# Patient Record
Sex: Female | Born: 1970 | Race: White | Hispanic: No | Marital: Single | Smoking: Current every day smoker
Health system: Southern US, Community
[De-identification: ages and names within clinical notes are randomized; demographics above are authoritative.]

## PROBLEM LIST (undated history)

## (undated) DIAGNOSIS — N809 Endometriosis, unspecified: Secondary | ICD-10-CM

## (undated) HISTORY — PX: ABDOMINAL HYSTERECTOMY: SHX81

---

## 1997-09-29 ENCOUNTER — Inpatient Hospital Stay (HOSPITAL_COMMUNITY): Admission: AD | Admit: 1997-09-29 | Discharge: 1997-09-29 | Payer: Self-pay | Admitting: Obstetrics and Gynecology

## 1997-10-01 ENCOUNTER — Inpatient Hospital Stay (HOSPITAL_COMMUNITY): Admission: AD | Admit: 1997-10-01 | Discharge: 1997-10-01 | Payer: Self-pay | Admitting: Obstetrics and Gynecology

## 1997-10-03 ENCOUNTER — Ambulatory Visit (HOSPITAL_COMMUNITY): Admission: RE | Admit: 1997-10-03 | Discharge: 1997-10-03 | Payer: Self-pay | Admitting: *Deleted

## 1997-10-25 ENCOUNTER — Inpatient Hospital Stay (HOSPITAL_COMMUNITY): Admission: AD | Admit: 1997-10-25 | Discharge: 1997-10-25 | Payer: Self-pay | Admitting: Obstetrics and Gynecology

## 1997-11-08 ENCOUNTER — Ambulatory Visit (HOSPITAL_COMMUNITY): Admission: RE | Admit: 1997-11-08 | Discharge: 1997-11-08 | Payer: Self-pay

## 1997-12-08 ENCOUNTER — Inpatient Hospital Stay (HOSPITAL_COMMUNITY): Admission: AD | Admit: 1997-12-08 | Discharge: 1997-12-11 | Payer: Self-pay | Admitting: Obstetrics & Gynecology

## 1999-07-22 ENCOUNTER — Emergency Department (HOSPITAL_COMMUNITY): Admission: EM | Admit: 1999-07-22 | Discharge: 1999-07-22 | Payer: Self-pay | Admitting: Emergency Medicine

## 1999-07-22 ENCOUNTER — Encounter: Payer: Self-pay | Admitting: Emergency Medicine

## 1999-09-02 ENCOUNTER — Inpatient Hospital Stay (HOSPITAL_COMMUNITY): Admission: AD | Admit: 1999-09-02 | Discharge: 1999-09-02 | Payer: Self-pay | Admitting: Obstetrics and Gynecology

## 1999-09-17 ENCOUNTER — Other Ambulatory Visit: Admission: RE | Admit: 1999-09-17 | Discharge: 1999-09-17 | Payer: Self-pay | Admitting: Obstetrics and Gynecology

## 1999-10-30 ENCOUNTER — Encounter (INDEPENDENT_AMBULATORY_CARE_PROVIDER_SITE_OTHER): Payer: Self-pay

## 1999-10-30 ENCOUNTER — Other Ambulatory Visit: Admission: RE | Admit: 1999-10-30 | Discharge: 1999-10-30 | Payer: Self-pay | Admitting: Obstetrics and Gynecology

## 1999-12-05 ENCOUNTER — Ambulatory Visit (HOSPITAL_COMMUNITY): Admission: RE | Admit: 1999-12-05 | Discharge: 1999-12-05 | Payer: Self-pay | Admitting: Obstetrics and Gynecology

## 2000-03-31 ENCOUNTER — Inpatient Hospital Stay (HOSPITAL_COMMUNITY): Admission: AD | Admit: 2000-03-31 | Discharge: 2000-03-31 | Payer: Self-pay | Admitting: Obstetrics and Gynecology

## 2000-05-13 ENCOUNTER — Encounter (INDEPENDENT_AMBULATORY_CARE_PROVIDER_SITE_OTHER): Payer: Self-pay

## 2000-05-14 ENCOUNTER — Inpatient Hospital Stay (HOSPITAL_COMMUNITY): Admission: RE | Admit: 2000-05-14 | Discharge: 2000-05-15 | Payer: Self-pay | Admitting: Obstetrics and Gynecology

## 2000-05-20 ENCOUNTER — Inpatient Hospital Stay (HOSPITAL_COMMUNITY): Admission: AD | Admit: 2000-05-20 | Discharge: 2000-05-20 | Payer: Self-pay | Admitting: Obstetrics and Gynecology

## 2000-05-24 ENCOUNTER — Inpatient Hospital Stay (HOSPITAL_COMMUNITY): Admission: AD | Admit: 2000-05-24 | Discharge: 2000-05-24 | Payer: Self-pay | Admitting: *Deleted

## 2000-09-19 ENCOUNTER — Emergency Department (HOSPITAL_COMMUNITY): Admission: EM | Admit: 2000-09-19 | Discharge: 2000-09-19 | Payer: Self-pay | Admitting: *Deleted

## 2000-10-09 ENCOUNTER — Encounter: Payer: Self-pay | Admitting: Emergency Medicine

## 2000-10-09 ENCOUNTER — Emergency Department (HOSPITAL_COMMUNITY): Admission: EM | Admit: 2000-10-09 | Discharge: 2000-10-09 | Payer: Self-pay | Admitting: Emergency Medicine

## 2000-11-02 ENCOUNTER — Emergency Department (HOSPITAL_COMMUNITY): Admission: EM | Admit: 2000-11-02 | Discharge: 2000-11-02 | Payer: Self-pay | Admitting: Emergency Medicine

## 2000-11-04 ENCOUNTER — Encounter: Admission: RE | Admit: 2000-11-04 | Discharge: 2000-11-04 | Payer: Self-pay | Admitting: Family Medicine

## 2000-11-04 ENCOUNTER — Encounter: Payer: Self-pay | Admitting: Family Medicine

## 2000-11-28 ENCOUNTER — Encounter: Admission: RE | Admit: 2000-11-28 | Discharge: 2000-11-28 | Payer: Self-pay | Admitting: Family Medicine

## 2000-11-28 ENCOUNTER — Encounter: Payer: Self-pay | Admitting: Family Medicine

## 2001-08-07 ENCOUNTER — Emergency Department (HOSPITAL_COMMUNITY): Admission: EM | Admit: 2001-08-07 | Discharge: 2001-08-07 | Payer: Self-pay

## 2001-09-17 ENCOUNTER — Encounter: Payer: Self-pay | Admitting: Family Medicine

## 2001-09-17 ENCOUNTER — Ambulatory Visit (HOSPITAL_COMMUNITY): Admission: RE | Admit: 2001-09-17 | Discharge: 2001-09-17 | Payer: Self-pay | Admitting: Family Medicine

## 2001-11-01 ENCOUNTER — Encounter: Payer: Self-pay | Admitting: *Deleted

## 2001-11-01 ENCOUNTER — Emergency Department (HOSPITAL_COMMUNITY): Admission: EM | Admit: 2001-11-01 | Discharge: 2001-11-01 | Payer: Self-pay | Admitting: *Deleted

## 2001-11-02 ENCOUNTER — Inpatient Hospital Stay (HOSPITAL_COMMUNITY): Admission: EM | Admit: 2001-11-02 | Discharge: 2001-11-04 | Payer: Self-pay | Admitting: Emergency Medicine

## 2001-11-02 ENCOUNTER — Encounter: Payer: Self-pay | Admitting: Emergency Medicine

## 2001-11-04 ENCOUNTER — Encounter: Payer: Self-pay | Admitting: Sports Medicine

## 2001-11-05 ENCOUNTER — Inpatient Hospital Stay (HOSPITAL_COMMUNITY): Admission: EM | Admit: 2001-11-05 | Discharge: 2001-11-08 | Payer: Self-pay | Admitting: *Deleted

## 2001-11-22 ENCOUNTER — Encounter: Admission: RE | Admit: 2001-11-22 | Discharge: 2001-11-22 | Payer: Self-pay | Admitting: Family Medicine

## 2002-04-25 ENCOUNTER — Encounter: Admission: RE | Admit: 2002-04-25 | Discharge: 2002-07-24 | Payer: Self-pay

## 2002-04-25 ENCOUNTER — Ambulatory Visit (HOSPITAL_COMMUNITY): Admission: RE | Admit: 2002-04-25 | Discharge: 2002-04-25 | Payer: Self-pay | Admitting: Obstetrics and Gynecology

## 2002-04-25 ENCOUNTER — Encounter (INDEPENDENT_AMBULATORY_CARE_PROVIDER_SITE_OTHER): Payer: Self-pay | Admitting: Specialist

## 2003-10-11 ENCOUNTER — Encounter: Admission: RE | Admit: 2003-10-11 | Discharge: 2003-10-11 | Payer: Self-pay | Admitting: Internal Medicine

## 2003-10-15 ENCOUNTER — Emergency Department (HOSPITAL_COMMUNITY): Admission: EM | Admit: 2003-10-15 | Discharge: 2003-10-15 | Payer: Self-pay | Admitting: Emergency Medicine

## 2003-10-17 ENCOUNTER — Ambulatory Visit (HOSPITAL_COMMUNITY): Admission: RE | Admit: 2003-10-17 | Discharge: 2003-10-17 | Payer: Self-pay | Admitting: *Deleted

## 2003-10-18 ENCOUNTER — Encounter: Admission: RE | Admit: 2003-10-18 | Discharge: 2003-10-18 | Payer: Self-pay | Admitting: Internal Medicine

## 2003-10-25 ENCOUNTER — Encounter: Admission: RE | Admit: 2003-10-25 | Discharge: 2003-10-25 | Payer: Self-pay | Admitting: Internal Medicine

## 2003-10-31 ENCOUNTER — Other Ambulatory Visit: Admission: RE | Admit: 2003-10-31 | Discharge: 2003-10-31 | Payer: Self-pay | Admitting: Obstetrics and Gynecology

## 2003-11-01 ENCOUNTER — Encounter: Admission: RE | Admit: 2003-11-01 | Discharge: 2003-11-01 | Payer: Self-pay | Admitting: Internal Medicine

## 2003-11-15 ENCOUNTER — Encounter: Admission: RE | Admit: 2003-11-15 | Discharge: 2003-11-15 | Payer: Self-pay | Admitting: Internal Medicine

## 2003-11-20 ENCOUNTER — Ambulatory Visit: Payer: Self-pay | Admitting: Internal Medicine

## 2003-11-28 ENCOUNTER — Encounter: Admission: RE | Admit: 2003-11-28 | Discharge: 2003-11-28 | Payer: Self-pay | Admitting: Internal Medicine

## 2003-12-05 ENCOUNTER — Ambulatory Visit (HOSPITAL_COMMUNITY): Admission: RE | Admit: 2003-12-05 | Discharge: 2003-12-05 | Payer: Self-pay | Admitting: Obstetrics and Gynecology

## 2003-12-05 ENCOUNTER — Encounter (INDEPENDENT_AMBULATORY_CARE_PROVIDER_SITE_OTHER): Payer: Self-pay | Admitting: Specialist

## 2003-12-08 ENCOUNTER — Inpatient Hospital Stay (HOSPITAL_COMMUNITY): Admission: AD | Admit: 2003-12-08 | Discharge: 2003-12-08 | Payer: Self-pay | Admitting: Obstetrics and Gynecology

## 2005-08-15 ENCOUNTER — Emergency Department (HOSPITAL_COMMUNITY): Admission: EM | Admit: 2005-08-15 | Discharge: 2005-08-15 | Payer: Self-pay | Admitting: Emergency Medicine

## 2006-01-16 IMAGING — US US PELVIS COMPLETE
1 series · 14 of 25 positions shown · non-contrast
Comparison: none

CLINICAL DATA: Lower abdominal pain. 
 ULTRASOUND OF PELVIS
 Transabdominal and transvaginal ultrasound of the pelvis were performed.  By history, the patient has had prior hysterectomy.  The right ovary measures 1.9 x 1.0 x 1.2 cm.  The left ovary measures 2.3 x 1.6 x 1.9 cm.  Blood flow is noted to both ovaries.

[Series 1: pelvis · 0.32mm/px · 14 of 29 slices shown]
[im 1/29]
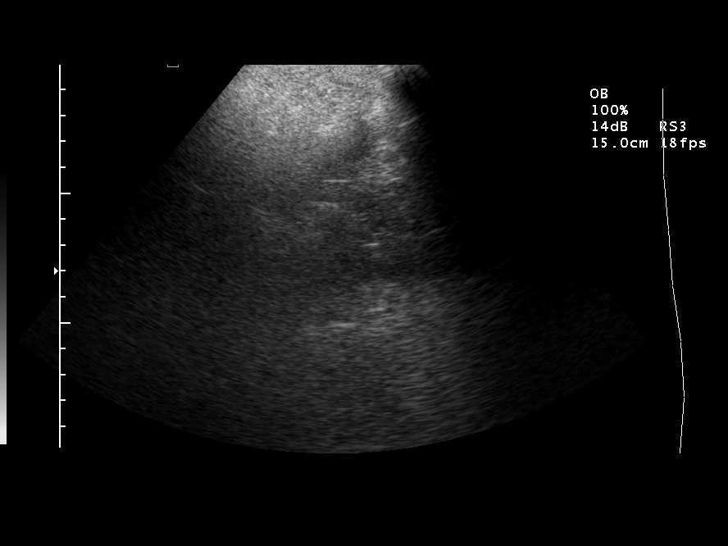
[im 3/29]
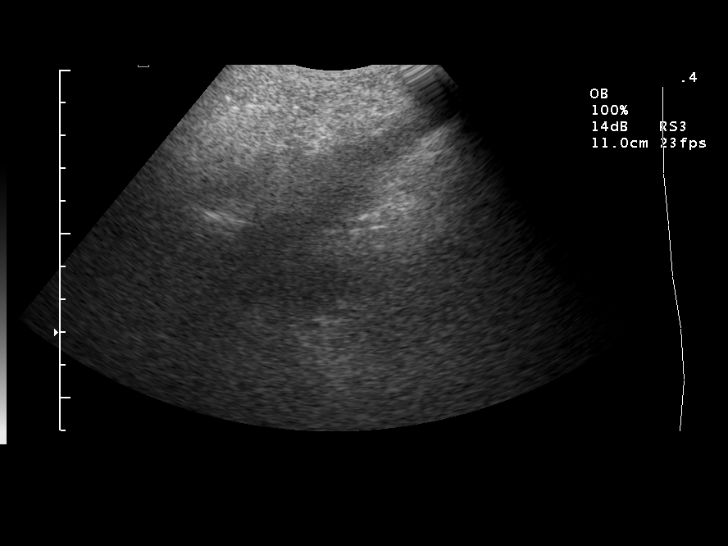
[im 5/29]
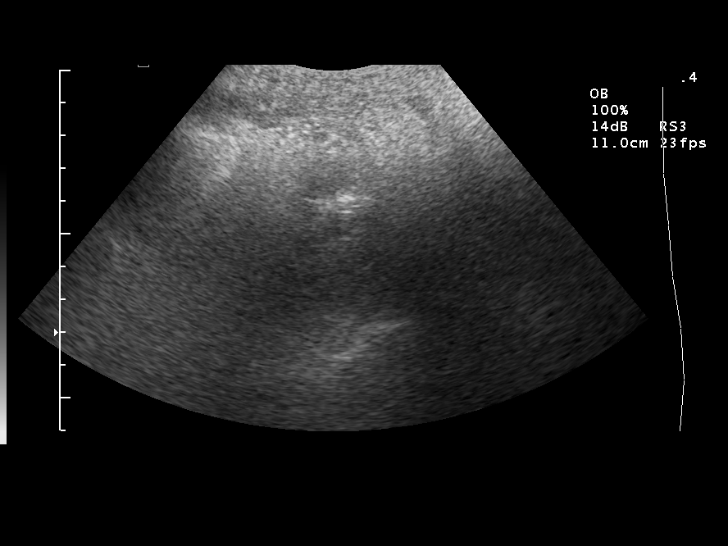
[im 8/29]
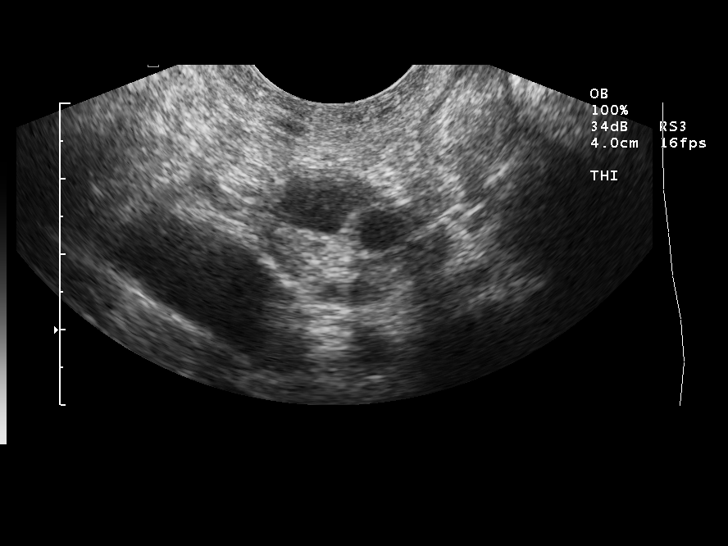
[im 10/29]
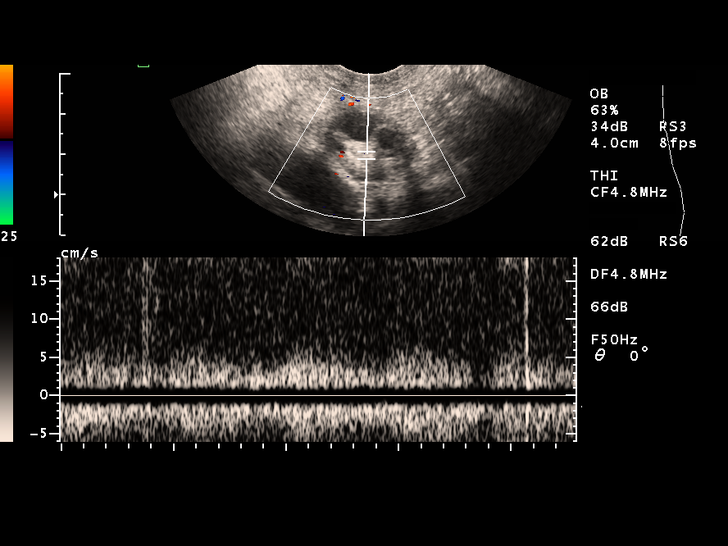
[im 11/29]
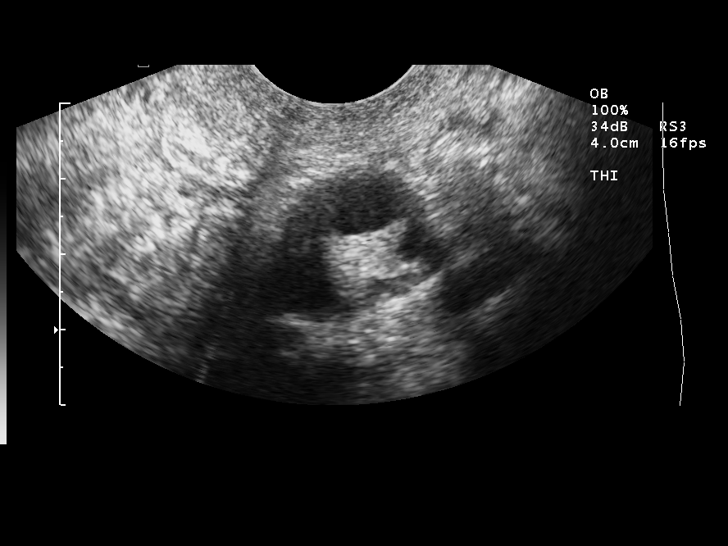
[im 13/29]
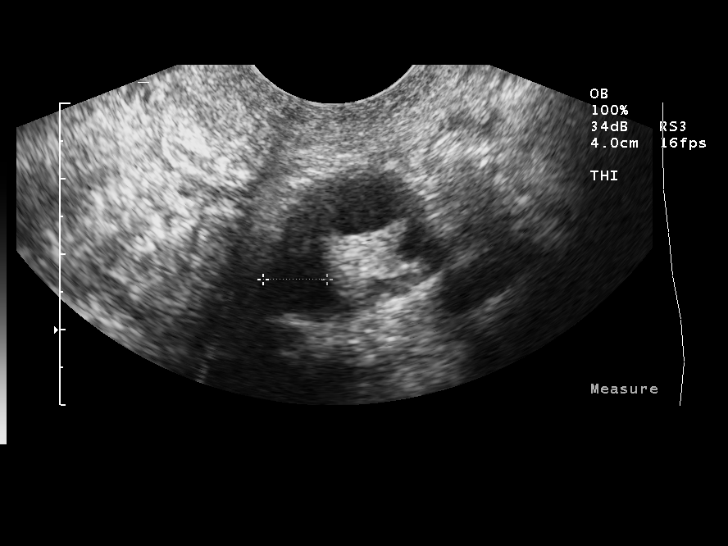
[im 16/29]
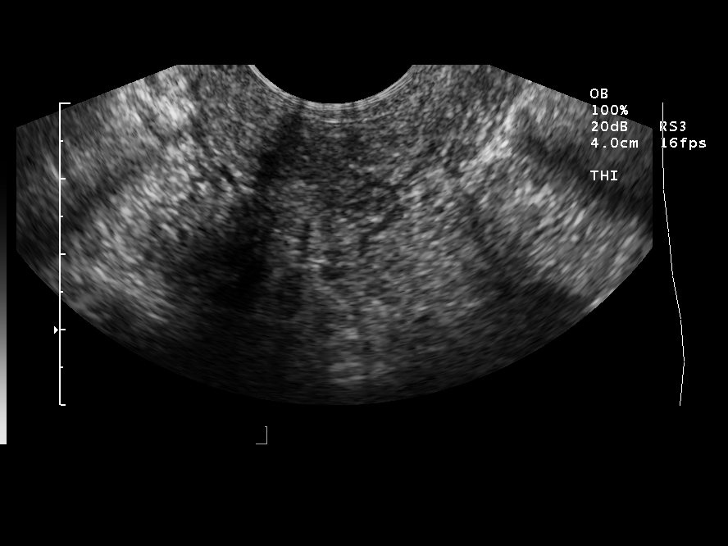
[im 18/29]
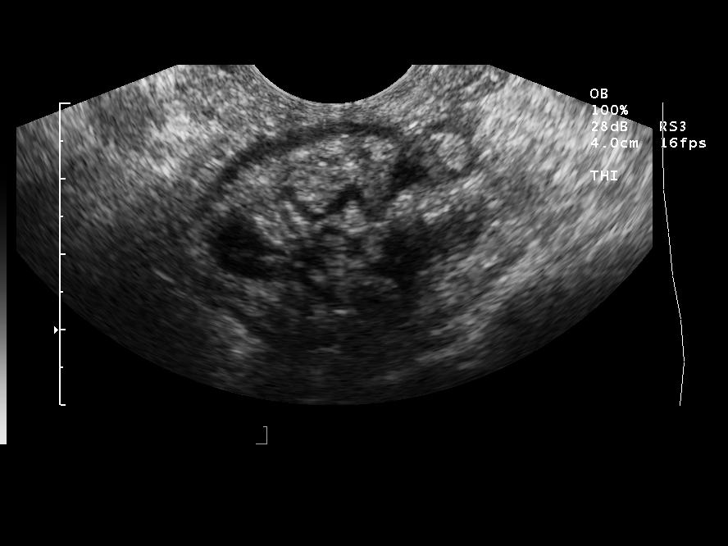
[im 19/29]
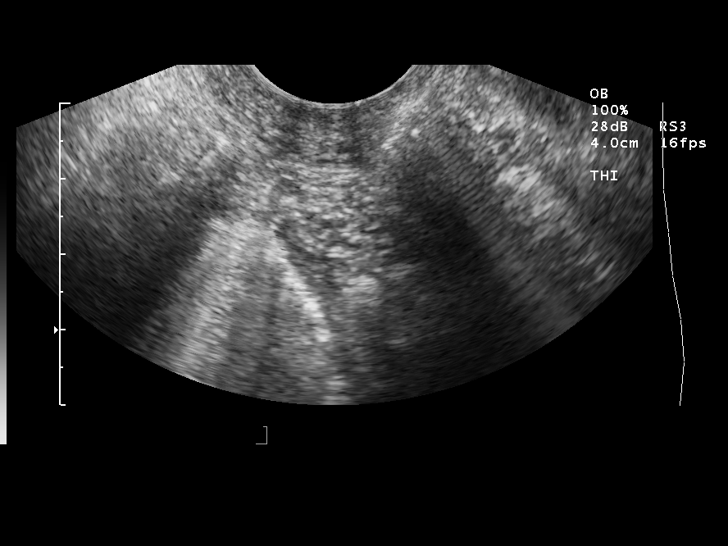
[im 22/29]
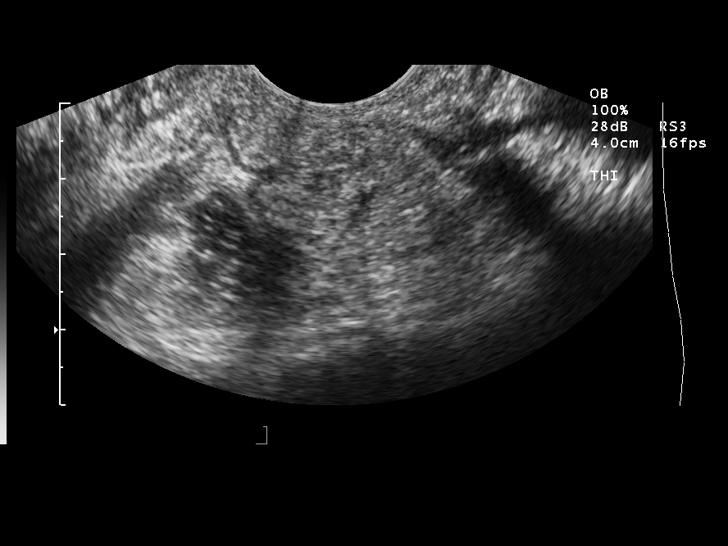
[im 24/29]
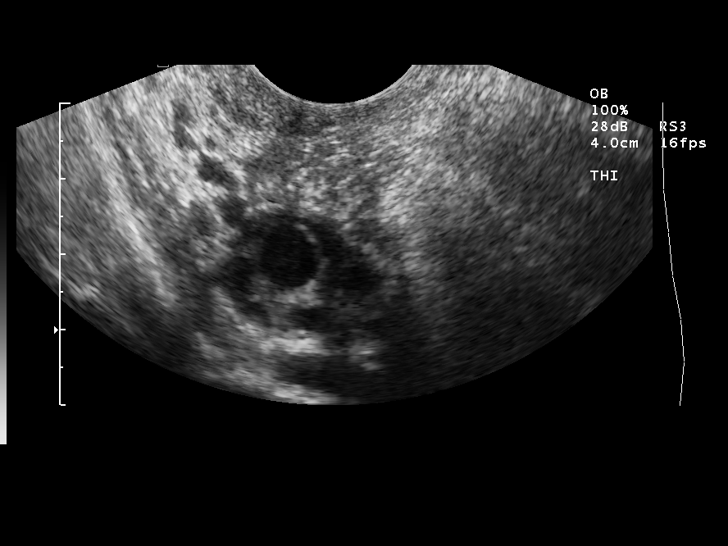
[im 26/29]
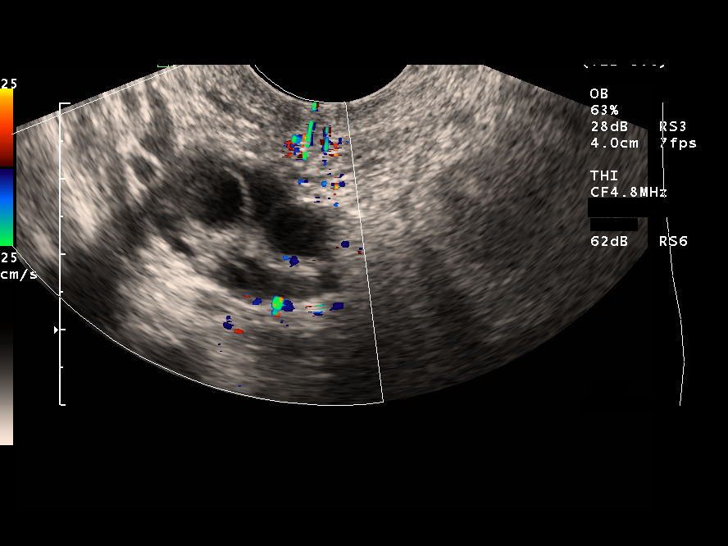
[im 29/29]
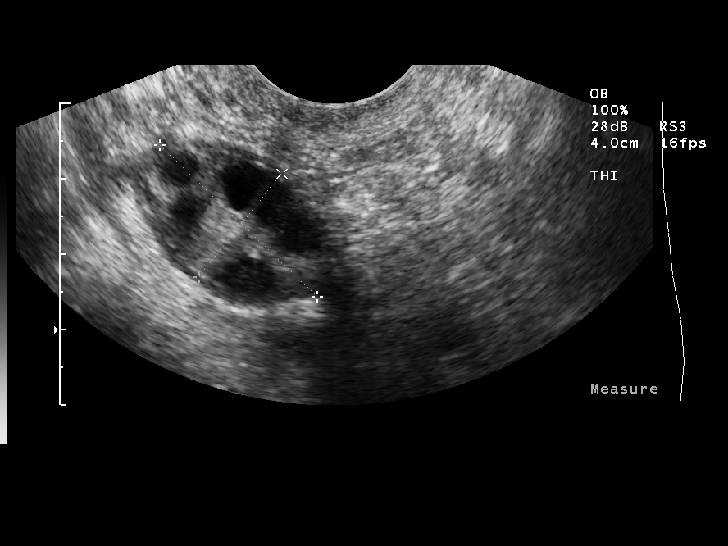

[14 of 25 positions shown; findings below may reference images not displayed]

IMPRESSION: Both ovaries appear normal with blood flow bilaterally.  Prior hysterectomy.

## 2006-03-11 IMAGING — US US PELVIS COMPLETE MODIFY
1 series · 18 of 22 positions shown · non-contrast
Comparison: none

CLINICAL DATA: Right salpingo oophorectomy with pain, pressure, and fever. 
 ULTRASOUND PELVIS COMPLETE AND ULTRASOUND PELVIS TRANSVAGINAL NON-OB

[Series 1: us pelvis complete · 18 of 22 slices shown]
[im 1/22]
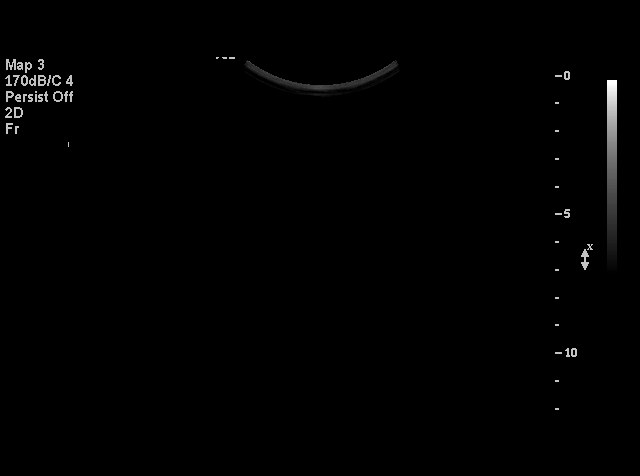
[im 2/22]
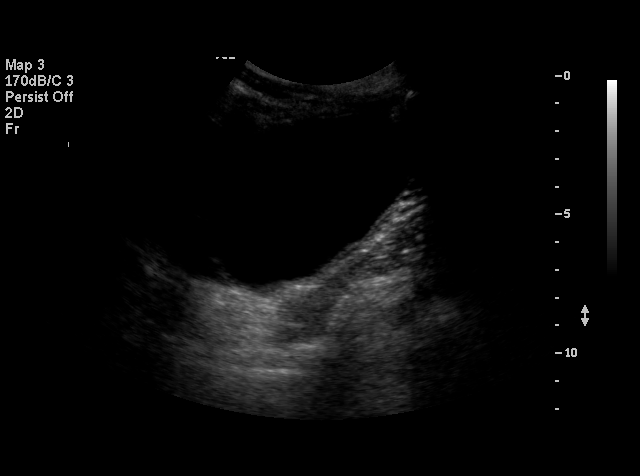
[im 4/22]
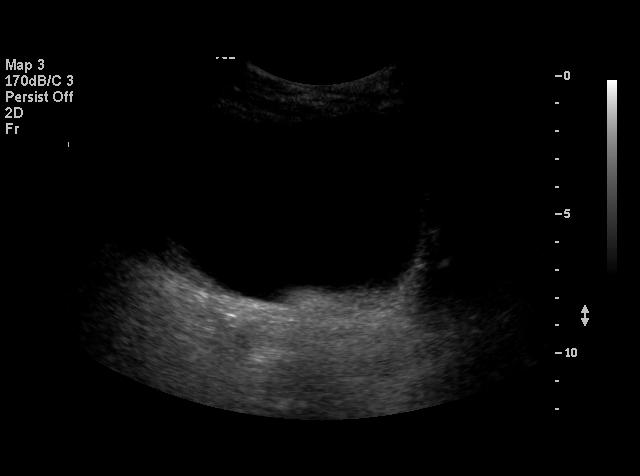
[im 5/22]
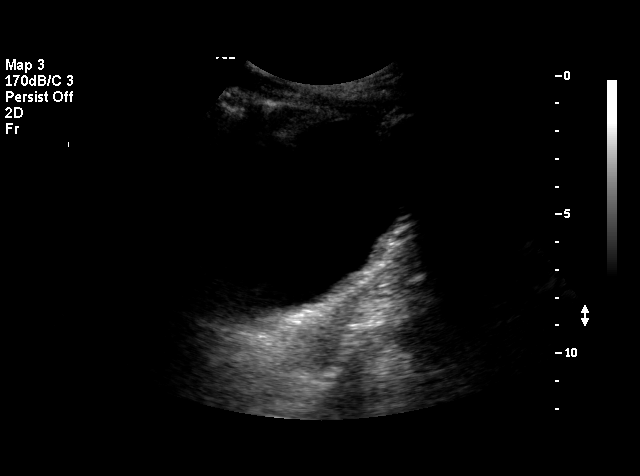
[im 6/22]
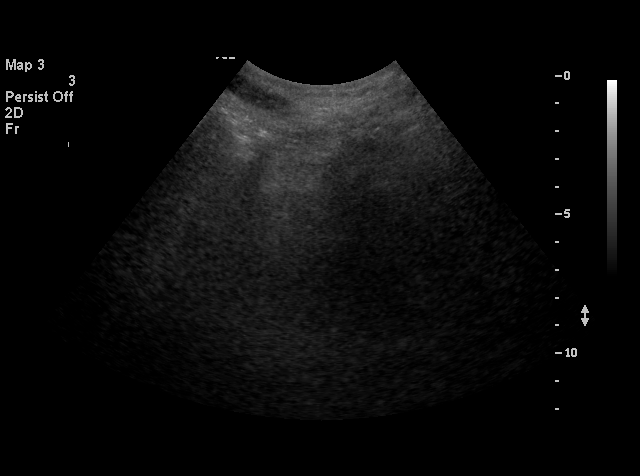
[im 7/22]
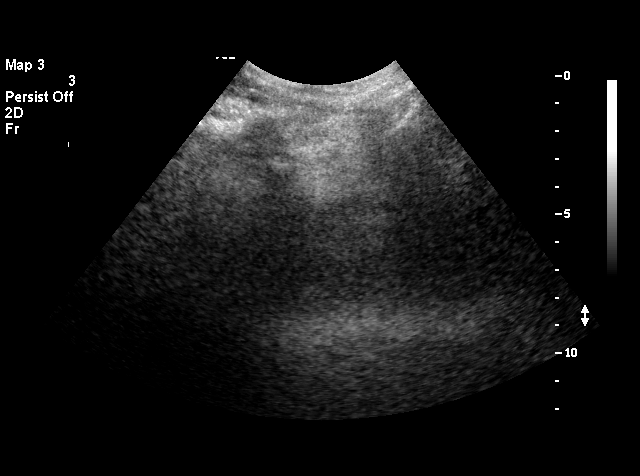
[im 8/22]
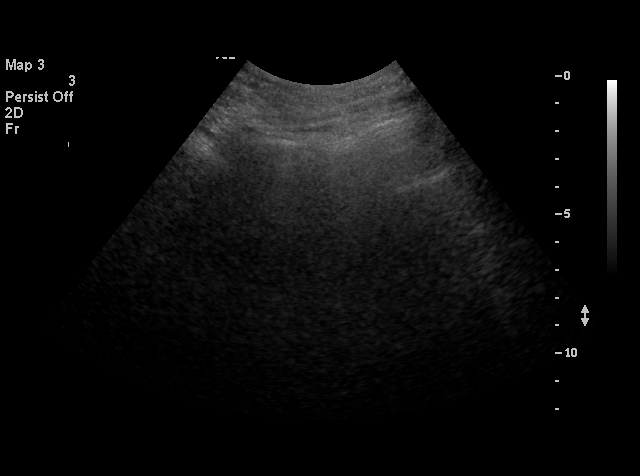
[im 10/22]
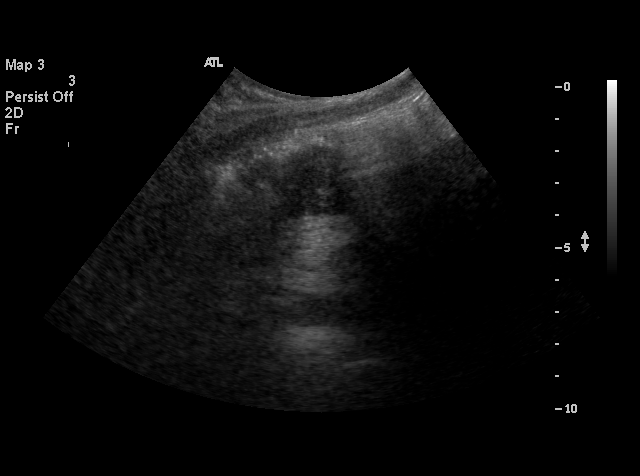
[im 11/22]
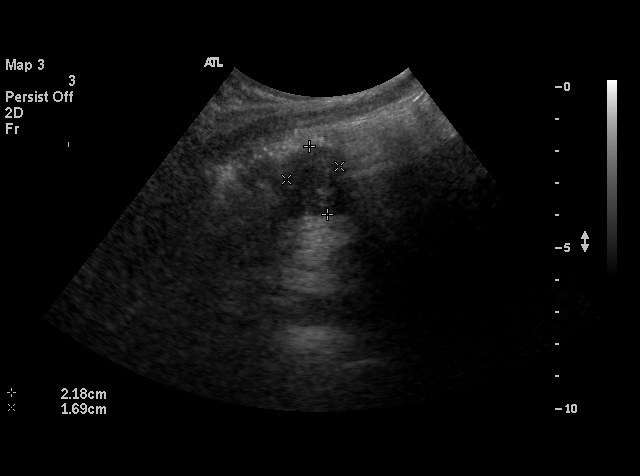
[im 12/22]
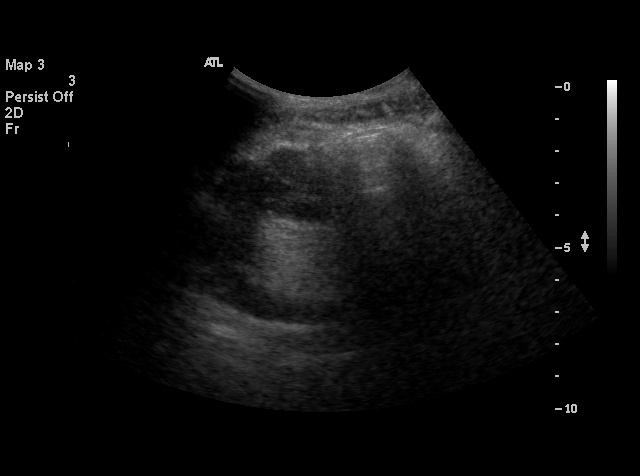
[im 13/22]
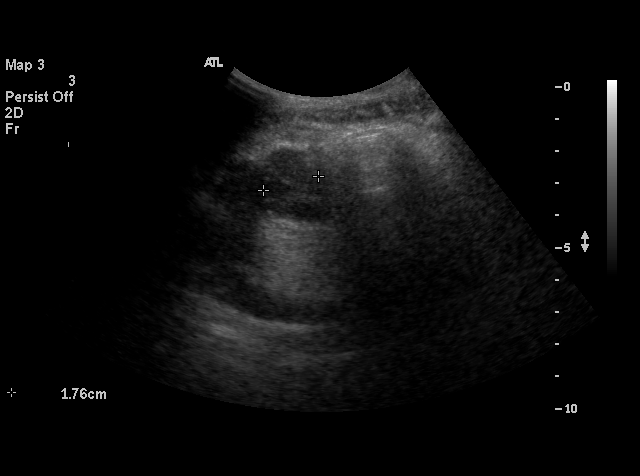
[im 15/22]
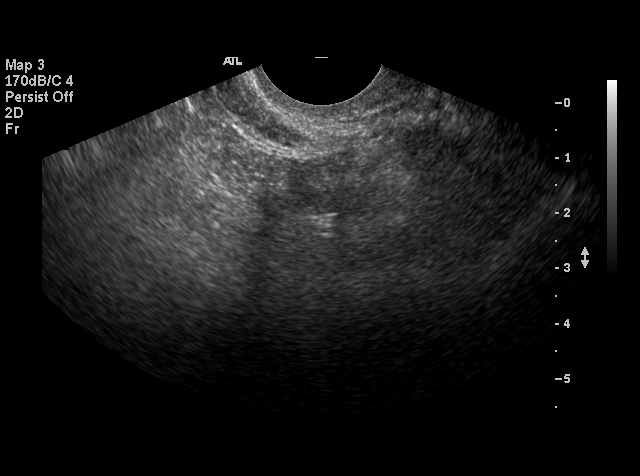
[im 16/22]
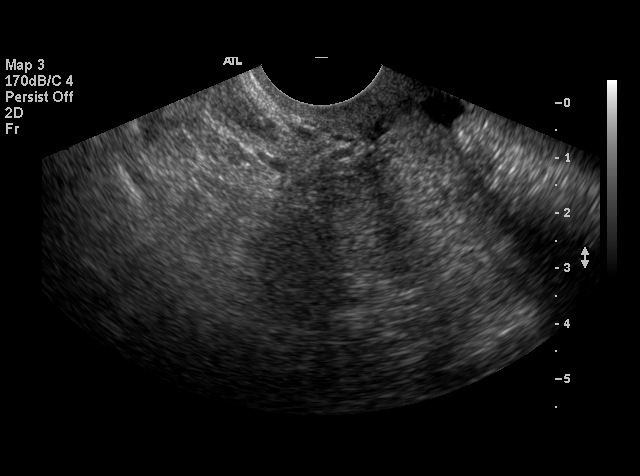
[im 17/22]
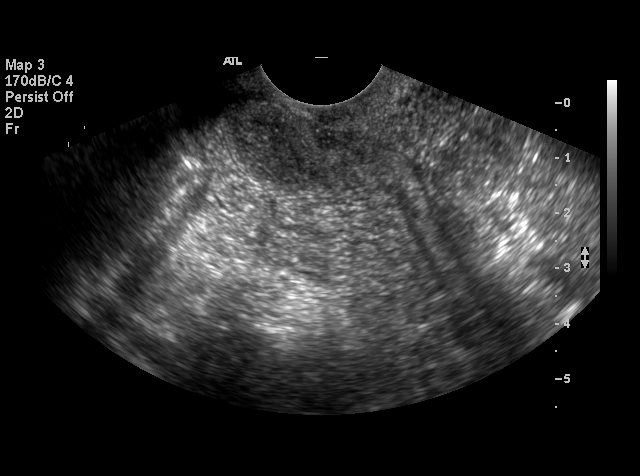
[im 18/22]
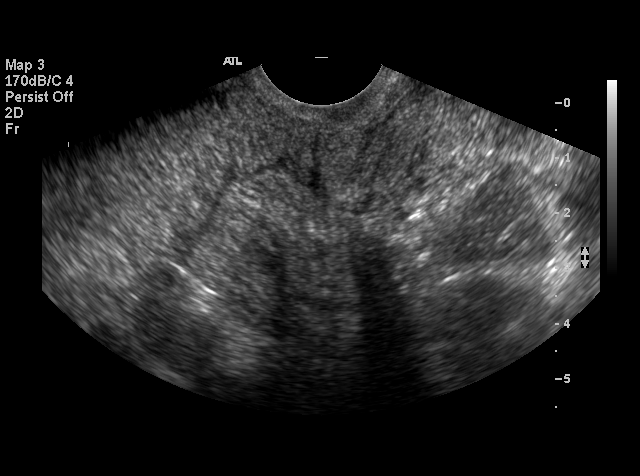
[im 19/22]
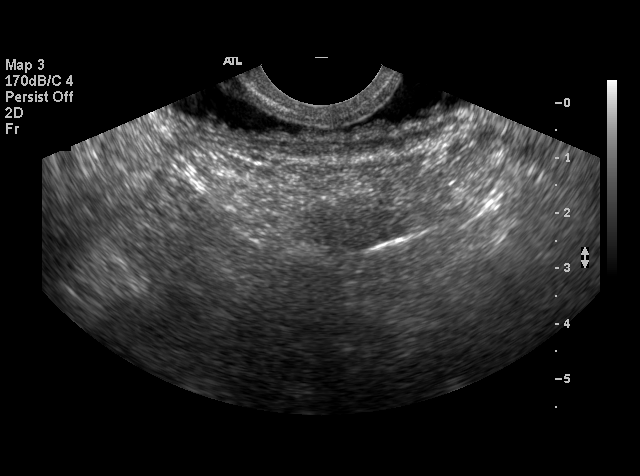
[im 21/22]
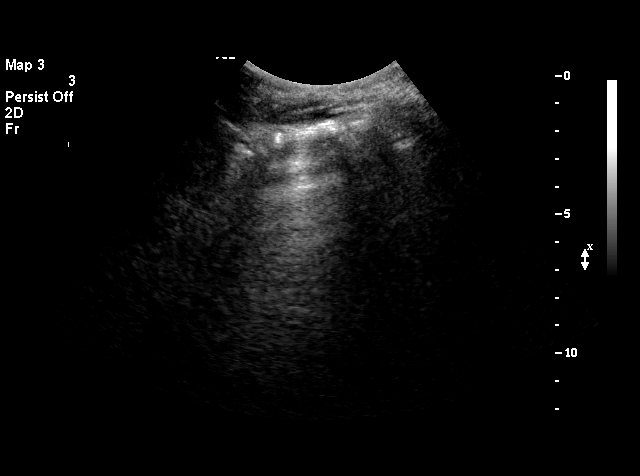
[im 22/22]
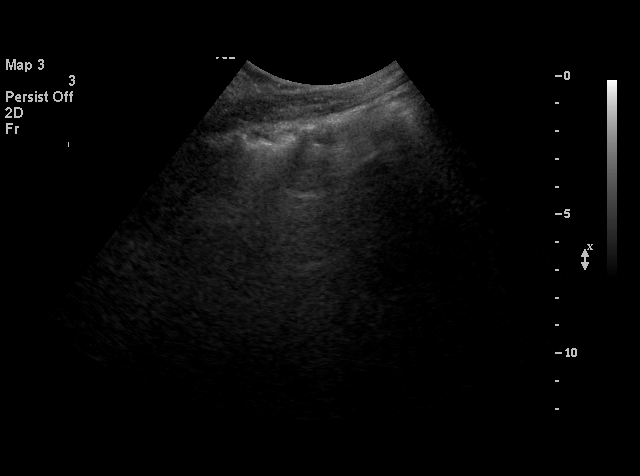

[18 of 22 positions shown; findings below may reference images not displayed]

FINDINGS: The uterus is surgically absent.  The right ovary is surgically absent.  The left ovary measures 2.2 x 1.8 x 1.8 cm.  No free pelvic fluid collections are seen.  If there is clinical suspicion for an abscess or other postoperative complication, a CT may be helpful for further evaluation. 
 IMPRESSION
 Normal appearance of the left ovary.  Uterus and right ovary have been removed.  No pelvic mass or free pelvic fluid collections.  CT may be helpful for further evaluation if there is any clinical suspicion for postoperative abscess or hematoma.

## 2008-02-07 ENCOUNTER — Emergency Department (HOSPITAL_COMMUNITY): Admission: EM | Admit: 2008-02-07 | Discharge: 2008-02-07 | Payer: Self-pay | Admitting: Emergency Medicine

## 2010-03-23 ENCOUNTER — Encounter: Payer: Self-pay | Admitting: Internal Medicine

## 2010-07-19 NOTE — Op Note (Signed)
NAME:  Sara, Hayden                           ACCOUNT NO.:  [id number]   MEDICAL RECORD NO.:  [id number]                   PATIENT TYPE:  AMB   LOCATION:  SDC                                  FACILITY:  [facility]   PHYSICIAN:  Dineen Kid. Rana Snare, M.D.                 DATE OF BIRTH:  [birth year]   DATE OF PROCEDURE:  04/25/2002  DATE OF DISCHARGE:                                 OPERATIVE REPORT   PREOPERATIVE DIAGNOSES:  1. Dyspareunia.  2. Pelvic pain.  3. Left lower quadrant pain.   POSTOPERATIVE DIAGNOSES:  1. Dyspareunia.  2. Pelvic pain.  3. Left lower quadrant pain.   1. Mild endometriosis.  2. Pelvic adhesions.   SURGEON:  Dineen Kid. Rana Snare, M.D.   ANESTHESIA:  General endotracheal anesthesia.   PROCEDURE:  Laparoscopy with lysis of adhesions and ablation of endometrial  implants.   INDICATIONS FOR PROCEDURE:  The patient is a 40 year old G4, P2, A2, status  post hysterectomy a year and a half ago for pelvic pain.  She had no  problems for approximately a year and a half.  She started having left lower  quadrant pain and worse with intercourse, now more of a chronic nature.  She  also had a normal GI work-up three months ago.  Because of persistent and  worsening left lower quadrant pain and dyspareunia, she presents for  laparoscopy for evaluation of pelvic pain, possible left salpingo-  oophorectomy.  Risks and benefits are discussed at length.  Informed consent  was obtained. See history and physical for further details.   FINDINGS:  Adhesions from the bowel to the cuff, otherwise normal-appearing  left tube and ovary and right tube and ovary.  There are right plaques in  both ovarian fossa and under the right cul-de-sac which appear to be  inflammatory in nature.  These were biopsied and ablated with bipolar  cautery, due to the probability of endometriosis.  No classic powder burn  lesions, however, were noted.  The amount of endometriosis correlates with  mild  endometriosis.  Normal-appearing appendix and liver.   DESCRIPTION OF PROCEDURE:  After adequate analgesia, the patient placed in  the dorsal lithotomy position.  She is sterilely prepped and draped.  The  bladder sterilely drained.  Sponge stick was placed in vaginal cuff.  A 1 cm  infraumbilical skin incision was made. A Veress needle was inserted. The  abdomen was insufflated with dullness to percussion.  The 11 mm trocar was  inserted and the above findings were noted.  A 5 mm trocar was inserted to  the left of the midline  two fingerbreadths above the pubic symphysis under  direct visualization.  After the above findings were noted, bipolar cautery  was used to cauterize all the areas suspicious for endometriosis.  Biopsy  along the left peritoneal wall of that white plaque was carried out and  hemostasis achieved with bipolar cautery.  Bipolar cautery and Endoshears  were used to sharply lyse the bowel to cuff pelvic adhesions.  Care was  taken to avoid injury to the bowel and also to underlying blood vessels.  After the procedure, no residual areas of endometriosis were remaining nor  adhesions.  Again, the ovaries appeared to be normal as well as the appendix  and liver.  At this point, the trocar was removed.  The abdomen was  desufflated.  The infraumbilical skin incision was closed with 0 Vicryl  figure-of-eight in the fascia and 3-0 Vicryl Rapide subcuticular stitch.  The 5 mm site was closed with interrupted suture of 0 Vicryl in the fascia  and 3-0 Vicryl Rapide subcuticular stitch.  The incisions were injected with  0.25% Marcaine with 1:200,000 epinephrine.  The patient tolerated the  procedure well and was stable on transfer to the recovery room.  The sponge  stick was removed from the vagina.  Sponge and instrument count was normal  x3. Estimated blood loss was less than 10 cc.   DISPOSITION:  The patient will be discharged home with the routine  instruction sheet for  laparoscopy.  She already has a prescription for Tylox  at home #30.  We will have her follow up in the office in two to three  weeks.                                               Dineen Kid Rana Snare, M.D.    DCL/MEDQ  D:  04/25/2002  T:  04/25/2002  Job:  [id number]

## 2010-07-19 NOTE — Discharge Summary (Signed)
NAME:  Sara Hayden, Sara Hayden                           ACCOUNT NO.:  [id number]   MEDICAL RECORD NO.:  [id number]                   PATIENT TYPE:  INP   LOCATION:  3031                                 FACILITY:  [facility]   PHYSICIAN:  Sibyl Parr. Fields, M.D.                DATE OF BIRTH:  06/03/1970   DATE OF ADMISSION:  11/02/2001  DATE OF DISCHARGE:  11/04/2001                                 DISCHARGE SUMMARY   DISCHARGE DIAGNOSES:  1. Nausea, vomiting, and abdominal pain - improved.  2. Dehydration.   MEDICATION:  Percocet 5/325 one to two q.4h. p.r.n.   DISCHARGE INSTRUCTIONS:  She is to advance her diet slowly.  No restrictions  to activity.   FOLLOW UP:  She is to follow up with her primary care physician at [facility] on Monday, November 08, 2001.  The patient is to make  this appointment.   HISTORY OF PRESENT ILLNESS:  The patient is a 40 year old female who  presented to the emergency department complaining of crampy abdominal pain  for five days followed by three days of nausea and vomiting.  She had only a  small amount of relief when she was treated in the [facility] Emergency Room  with Phenergan, Zofran, Dilaudid, and IV fluids.   PAST MEDICAL HISTORY:  This was significant for hysterectomy due to pain  from endometriosis and prior C-section.   PHYSICAL EXAMINATION UPON ADMISSION:  VITAL SIGNS:  The patient was  afebrile.  Other vital signs were within normal limits.  ABDOMEN:  Soft diffusely, tender with no guarding or rebound. She had some  high pitched bowel sounds.   LABS ON ADMISSION:  Urinalysis showed an increased specific gravity of  greater than 1.04, greater than 80 ketones and 30 protein, otherwise normal.  Urine pregnancy test was negative.  CBC was normal.  Lipase was normal.  Liver function tests were normal.  Electrolytes were normal with the  exception of a potassium of 3.4.   Abdominal x-ray showed a decreased prominence of central  bowel loops  compared to prior abdominal film from the day before.  There was a question  of a mild focal ileus.   HOSPITAL COURSE:  The patient was admitted for observation and rehydration.  She was kept NPO, given IV fluids, Phenergan/Zofran, and morphine p.r.n.   Throughout the hospital stay, the patient in general slowly improved.  her  potassium was replaced and at discharge, her potassium was 3.8.  Also during  her stay, her hemoglobin decreased from 13.7 to 11.4.  This was attributed  to be likely secondary to a hemodilution as the hemoglobin was stable after  that.  At discharge it was 11.5.   On November 04, 2001, a repeat abdominal film was performed which showed a  small amount of increased gas in the bowel but no obstruction or  perforation.  A pelvic ultrasound was also done that showed no free fluid  and ovaries within normal limits.   On discharge, the patient was feeling better, tolerating clears.  She was  provided with a prescription of Percocet for pain and was to follow up with  her primary care physician on November 08, 2001.     Dr. Romilda Garret B. Darrick Penna, M.D.    DH/MEDQ  D:  11/05/2001  T:  11/07/2001  Job:  [id number]   cc:   [facility]

## 2010-07-19 NOTE — H&P (Signed)
NAME:  Sara Hayden, Sara Hayden                           ACCOUNT NO.:  [id number]   MEDICAL RECORD NO.:  [id number]                   PATIENT TYPE:  INP   LOCATION:  3031                                 FACILITY:  [facility]   PHYSICIAN:  Sibyl Parr. Fields, M.D.                DATE OF BIRTH:  [birth year]   DATE OF ADMISSION:  11/02/2001  DATE OF DISCHARGE:                                HISTORY & PHYSICAL   SERVICE:  Conservation officer, historic buildings.   CHIEF COMPLAINT:  Nausea, vomiting and abdominal pain.   HISTORY OF PRESENT ILLNESS:  [age]-year-old Caucasian female who  presents with a complaint of five days of crampy abdominal pain.  She woke  on the day prior to admission with emesis.  She presented to the ER on the  day prior to admission and was given IV Phenergan with some relief and was  diagnosed with viral gastroenteritis and subsequently sent home with  Phenergan and Vicodin.  However, she continued to vomit and experienced  increased abdominal pain today.  Her emesis is bilious but non-bloody.  She  also complains of knots in her belly.  She cannot keep down solids or  liquids.  She had had diarrhea yesterday after her Phenergan suppository but  last bowel movement prior to that was October 30, 2000.  No sick contacts.  No significant p.o.'s since October 31, 2001 p.m.  She received Phenergan,  Zofran, Dilaudid, IV fluids in the ER with some relief.   REVIEW OF SYSTEMS:  Positive for back and leg pain, positive subjective  fever and chills, positive finger and lower leg numbness today.  The patient  denies headache, URI symptoms, chest pain, shortness of breath, edema,  dysuria, hematuria, rash.   PAST MEDICAL HISTORY AND PAST SURGICAL HISTORY:  C-section in 1994; partial  hysterectomy, March 2002; back surgery in 2002 and 2003 for chronic back  pain; the patient also had an exploratory laparoscopy for endometriosis in  March 2002.   MEDICATIONS:  Hydrocodone on and off p.r.n.  back pain, none currently.   ALLERGIES:  DARVOCET causes respiratory distress, MAGNESIUM SULFATE causes  hives and facial edema.   SOCIAL HISTORY:  The patient lives in [place] with her husband and  18-year-old and 29-year-old sons.  She is a stay-at-home mom.  She does smoke  1 pack every 2 to 3 days for about the last 10 years.  She denies alcohol or  drugs.   FAMILY HISTORY:  Mother has fibromyalgia and GERD/PUD.  Father has asthma,  emphysema, and hypertension.  The patient's 68-year-old son has asthma and  her 76-year-old son has febrile seizures.   PHYSICAL EXAMINATION:  GENERAL:  A well-developed, well-nourished adult  Caucasian female, awake and alert, tired appearing.  VITAL SIGNS:  Temperature 99.1, orally; respirations 20; pulse 63; blood  pressure 127/73; oxygen saturation 100% on room air.  HEENT:  Normocephalic,  atraumatic.  Pupils equal, round and reactive to  light.  Extraocular movements intact, and mucous membranes are dry, nares  clear.  NECK:  No thyromegaly or lymphadenopathy, supple.  CARDIOVASCULAR:  Regular rate and rhythm; no murmurs, rubs, or gallops.  LUNGS:  Clear to auscultation bilaterally.  BACK:  No spine or CVA tenderness.  ABDOMEN:  Soft, diffusely tender to palpation, no guarding or rebound,  positive high-pitched bowel sounds throughout.  EXTREMITIES:  No clubbing, cyanosis, or edema; 2+ radial and dorsalis pedis  pulses bilaterally.  Strength 5/5 throughout.  NEUROLOGIC:  As above.  Alert and oriented x3, cranial nerves II-XII grossly  intact, moves all extremities.  RECTAL:  Heme-negative, and normal rectal tone; no masses or stool palpable  in the rectal vault.   LABORATORY AND ACCESSORY CLINICAL DATA:  UA shows specific gravity greater  than 1.040, pH 7.5, small bilirubin, greater than 80 ketones, 30 of protein,  negative for nitrite and LE, 0-2 white blood cells, 0-2 rbc's, rare  bacteria, mucus.  Urine pregnancy test is negative.   WBC 7.6, hemoglobin  13.7, platelets 337,000, with normal differential.  Lipase 16.  Sodium 138,  potassium 3.4, chloride 106, CO2 23, BUN 10, creatinine 0.6, glucose 103,  calcium 8.9, total protein 7.0, albumin 3.9, AST 12, ALT 11, alkaline  phosphatase 60, total bilirubin 0.8.   Abdominal x-ray series:  Decreased prominence in central small bowel loops,  ? mild small-bowel obstruction compared to the prior study dated November 01, 2001.   Chest x-ray is unremarkable.   ASSESSMENT AND PLAN:  [age]-year-old Caucasian female with nausea,  vomiting and abdominal pain.   1. Nausea, vomiting and abdominal pain:  Differential diagnosis includes     ileus, early small-bowel obstruction (especially in the setting of     multiple prior abdominal surgeries with possible adhesions), viral     gastroenteritis, possible new presentation of inflammatory bowel disease.     Etiologies such as pancreatitis, hepatitis, appendicitis and pelvic     inflammatory disease are less likely, given laboratories and exam.  As     the abdomen is not distended and the emesis has slowed down in the     emergency room, we will not place an nasogastric tube at this time.  For     now, we will treat the patient with intravenous fluids, Phenergan and     Zofran p.r.n., morphine sulfate p.r.n. pain and we will make her n.p.o.  2. Fluids, electrolytes, and nutrition:  Objective findings are consistent     with mild-to-moderate dehydration.  The patient is status post 2 L of     normal saline bolus in the emergency room and we will now continue     aggressive rehydration.  Replete potassium in the intravenous fluids and     check a morning.     Noelle C. Merilynn Finland, M.D.                 Sibyl Parr. Darrick Penna, M.D.    NCR/MEDQ  D:  11/03/2001  T:  11/03/2001  Job:  [id number]   cc:   [facility]

## 2010-07-19 NOTE — H&P (Signed)
[facility]  Patient:    Sara Hayden, Sara Hayden                        MRN: [id number] Adm. Date:  [id number] Attending:  Trevor Iha                         History and Physical  HISTORY OF PRESENT ILLNESS:   Ms. Chohan is a 40 year old, G4, P2, AB2 with worsening pelvic pain and dyspareunia, a long history of pain worsening over the last several years. She has had a laparoscopy which showed a mildly enlarged uterus and a question of adenomyosis, not responsive to conservative medical management including anti-inflammatory medications or oral contraceptive agents. She proceeded with Lupron Depot. The pain actually appeared to be getting worse. The pain has approached the point where she is unable to work, it is incapacitating which requires several trips to the emergency room for pain medications. She is currently on anti-inflammatory medications as well as Tylox for pain. Also, she has been tried on Effexor and is currently on that to help deal with the pain. She has been having some abnormal bleeding since institution of the Lupron Depot. She presents today for definitive surgical intervention, even though she does desire more children in the future, she has come to the realization that she cannot continue with this kind of pain and both her and her husband wished to proceed with a hysterectomy and after much counseling and much thought with regards to this, she elects to proceed with that course of action today.  PAST SURGICAL HISTORY:        Cesarean section in 1994.  PAST OBSTETRICAL HISTORY:     Significant for two deliveries and two miscarriages.  PHYSICAL EXAMINATION:  VITAL SIGNS:                  Blood pressure 92/54, weight is 131 pounds.  CARDIOVASCULAR:               Regular rate and rhythm.  LUNGS:                        Clear to auscultation bilaterally.  ABDOMEN:                      Nondistended, nontender.  PELVIC:                        The uterus is anteverted, mobile, slightly enlarged, mildly boggy, tenderness to deep palpation. No adnexal masses are palpable.  LABORATORY AND ACCESSORY DATA: Ultrasound of the pelvis is normal.  IMPRESSION AND PLAN:  Dyspareunia and pelvic pain not responsive to conservative medical management including laparoscopy, anti-inflammatory medications, Lupron Depot, and oral contraceptive agents. The patient desires definitive surgical intervention. Again, after long discussions with the patient and her family over an extended period of time, they wish to proceed with a hysterectomy. The risks and benefits were discussed at length including but not limited to risk of infection, bleeding, damage to bowel/bladder, ovaries, tubes, and ureters, and also the possibility that this may not alleviate her pain. They do give their informed consent. DD:  05/13/00 TD:  05/13/00 Job: [id number]

## 2010-07-19 NOTE — H&P (Signed)
NAME:  Sara Hayden, Sara Hayden                           ACCOUNT NO.:  [id number]   MEDICAL RECORD NO.:  [id number]                   PATIENT TYPE:  AMB   LOCATION:  SDC                                  FACILITY:  [facility]   PHYSICIAN:  Dineen Kid. Rana Snare, M.D.                 DATE OF BIRTH:  1970/06/07   DATE OF ADMISSION:  04/25/2002  DATE OF DISCHARGE:                                HISTORY & PHYSICAL   HISTORY OF PRESENT ILLNESS:  The patient is a 41 year old G4 P2 A2 status  post hysterectomy in March 2002 for pelvic pain.  Had no problems with  pelvic pain for a year-and-a-half, then started having left lower quadrant  pain, worse with intercourse; now pain more often that not.  She had a  normal GI workup three months ago when she was hospitalized with a virus.  Now, because of persistent left lower quadrant pain and dyspareunia, she  presents for laparoscopic removal of the left tube and ovary and possible  lysis of adhesions.   PAST MEDICAL HISTORY:  Negative.   PAST SURGICAL HISTORY:  1. C section in 1994.  2. Miscarriage in 1997  3. Vaginal birth in 1999.  4. Laparoscopy in 2001.  5. Laparoscopy-assisted vaginal hysterectomy in 2002.   MEDICATIONS:  Currently on Tylox and Vioxx.   ALLERGIES:  MAGNESIUM SULFATE and DARVOCET.   PHYSICAL EXAMINATION:  VITAL SIGNS:  Blood pressure 104/64.  HEART:  Regular rate and rhythm.  LUNGS:  Clear to auscultation bilaterally.  ABDOMEN:  Nondistended, nontender.  No organomegaly is palpable.  PELVIC:  No adnexal masses are palpable.  She is nontender to deep  palpation.   LABORATORY DATA:  Ultrasound shows normal-appearing ovaries, no adnexal  masses, normal cul-de-sac.   IMPRESSION AND PLAN:  Dyspareunia, pelvic pain, left lower quadrant pain.  It is cyclical in nature but also constant in nature.  She had previously  tried Lupron Depot without much success.  At this time she desires more  definitive surgical evaluation and treatment and she  would like a left  salpingo-oophorectomy.  At this time we will plan on proceeding with a  laparoscopic LSO.  However, if it looks like there is something very obvious  and we are able to preserve the ovaries we will try to do that.  We will  definitely try to save at least one ovary - preferably the right ovary.  Will plan to proceed with CO2 laser standby for ablation of possible  endometrial implants as she does have a history of this.  Risks and benefits  were discussed at length which include but not limited to risks of  infection; bleeding; damage to bowel, bladder, ureters, and ovaries; the  possibility of not being able to alleviate the pelvic pain or the  possibility of having it recur.  She does give her informed consent.  Dineen Kid Rana Snare, M.D.   DCL/MEDQ  D:  04/23/2002  T:  04/23/2002  Job:  [id number]

## 2010-07-19 NOTE — H&P (Signed)
[facility]  Patient:    Sara Hayden, Sara Hayden                          MRN: [id number] Adm. Date:  [id number] Attending:  Trevor Iha                         History and Physical  HISTORY OF PRESENT ILLNESS:   Sara Hayden is a 40 year old G4, P2 with worsening pelvic pain worse over the last several months began in the left lower quadrant and proceeds to the right lower quadrant also described as cramping not associated with bleeding and not improved with oral contraceptive agents or anti-inflammatory medications. Currently she is requiring narcotics for the pelvic pain. Ultrasound was normal. The patient presents for surgical evaluation and treatment. She does have a strong family history of endometriosis.  PAST MEDICAL HISTORY:         Negative.  PAST SURGICAL HISTORY:        Significant for a cancer in 1994.  PAST OB HISTORY:              Cesarean section for breach in 1994. She has had 2 miscarriages and a vaginal delivery.  PAST GYN HISTORY:             No history of sexually transmitted diseases or abnormal Pap smears.  MEDICATIONS:                  Currently on Ortho-Cyclen, Vicodin and Vioxx.  PHYSICAL EXAMINATION:  VITAL SIGNS:                  Blood pressure is 92/54, weight 131.  HEART:                        Regular rate and rhythm.  LUNGS:                        Clear to auscultation bilaterally.  ABDOMEN:                      Nondistended, nontender.  PELVIC:                       The uterus is anteverted, mobile with mild to moderate tenderness with movement. No masses are palpable.  IMPRESSION:                   Pelvic pain with ______ towards to the right lower quadrant, minimal improvement with anti-inflammatory medications or narcotics. Normal appearing ultrasound. The patient desires to have the surgical evaluation treatment. She has a strong family history of endometriosis.  PLAN:                         Laparoscopy  with possible CO2 laser oblation and possible lysis of adhesions. The risks and benefits were discussed at length including but not limited to risk of infection, bleeding, damage to bowel, bladder, ureters, tubes, ovaries, inability to alleviate pain, and need for further surgery. DD:  12/05/99 TD:  12/05/99 Job: [id number]

## 2010-07-19 NOTE — Discharge Summary (Signed)
NAME:  Sara Hayden, Sara Hayden                           ACCOUNT NO.:  [id number]   MEDICAL RECORD NO.:  [id number]                   PATIENT TYPE:  INP   LOCATION:  5727                                 FACILITY:  [facility]   PHYSICIAN:  Wynonia Lawman                         DATE OF BIRTH:  Jan 29, 1971   DATE OF ADMISSION:  11/05/2001  DATE OF DISCHARGE:  11/08/2001                                 DISCHARGE SUMMARY   DISCHARGE DIAGNOSES:  1. Nausea/vomiting and abdominal pain, likely secondary to gastroenteritis.  2. Abnormal thyroid function.  3. Chronic low back pain.   MEDICATIONS:  1. Phenergan 25 mg suppositories one PR q.4h. p.r.n. nausea or vomiting.     The patient may also use p.o. Phenergan which she says she already has at     home.  2. Vicodin 500/5, one to two tab q.4h. p.r.n. pain.  The patient is given a     prescription for #20, with no refills.  3. May use acetaminophen suppositories 325 mg PR q.4h. p.r.n. pain/fever.     The patient instructed not to take more than 400 mg of Tylenol in 24     hours, which would include the amount of Tylenol in the Vicodin as well     as in the suppository.   DISCHARGE INSTRUCTIONS:   DIET:  The patient was recommended to eat small frequent meals.  She is to  start with clear liquids, then advance to a small amount of bland foods like  crackers and apple sauce, then advance to a full meal as tolerated.   FOLLOW UP:  She is to make a follow-up appointment with her primary doctor,  Dr. Buren Kos in [facility] in one to two days.  She was also notified that she will need follow-up thyroid laboratory panel  in four to six weeks.  Advised the patient that she should call her health care Tavoris Brisk or return  for re-evaluation if she were to find that she did not continue to improve,  or her symptoms lasted for longer than a total of 14 days.   HOSPITAL COURSE:  The patient was admitted on November 05, 2001, with  complaint of continued nausea, vomiting, and abdominal pain that had  continued from a prior admission of November 02, 2001, to November 04, 2001.  She was unable to keep down anything by mouth.  #1 - NAUSEA/VOMITING/ABDOMINAL PAIN:  The patient underwent a CT with  contrast of the abdomen and pelvis which revealed a diffuse low attenuation  of the liver which is compatible with fatty infiltrate.  It was noted the  common bile duct was 7.0 mm, which per GI consultation was normal.  A CT of  the abdomen and pelvis were otherwise within normal limits.  On November 07, 2001, the gastrointestinal service was  consulted.  They felt that the  etiology of her symptoms was likely secondary to viral infection; however,  could not entirely rule out a low-grade partial small bowel obstruction.  They recommended repeat plain films of her abdomen if her symptoms were to  worsen.  Stool cultures showed no suspicious colonies on initial evaluation.  (A final reading is pending to date.)  Stool O&P were negative.  Clostridium  difficile was negative.  White blood cell count on admission was 11.1.  Throughout the hospital stay the patient received IV Protonix, IV Phenergan  p.r.n., IV Reglan p.r.n., Tylenol, Vicodin p.r.n., and Toradol.  She was  also rehydrated with D-5 1/2 normal saline 100 cc per hour with  40 mEq of  Kay-Ciel.  Throughout the hospital course she continued to have a complaint  of some crampy abdominal pain.  The nausea improved and she was able to  tolerate some clears at discharge with a minimal amount of emesis.  While it  seems likely that the etiology of her symptoms is due to a virus, because of  her history of having abdominal surgery, there is still a small possibility  that she has a low-grade partial small bowel obstruction; however, there is  no indication that this is the case, or is worsening at this time.  If she  does not continue improving as an outpatient, it is  recommended that she  have follow-up plain films, to evaluate for the possibility of  a partial  small bowel obstruction.  #2 - PAIN CONTROL:  Throughout this hospital stay there was concern that the  patient was making pain complaints that were in excess of the clinical exam.  There is concern that she might have drug-seeking behavior or potential.  A  lot of her pain complaints mostly involved her lower back pain which  seemingly appears to be a chronic process, for which she has been seen by a  neurologist in the past.  The patient's use of narcotics was discussed with  her and with her husband by Dr. Nolon Nations and by myself.  Although  it is not clear or not that she has a narcotic dependence, there is concern  that she may have the potential for that.  I explained to her that narcotic  pain medicines, especially IV pain medicines which she requested on multiple  occasions, were not the optimum treatment for chronic pain.  The patient  said that she was in the process of trying to find a different neurosurgeon,  so that she could be re-evaluated.  Also suggested that she may consider the  possibility of being evaluated at a chronic pain clinic.  Since this is an  ongoing process, it is recommended that she have ongoing and long-term  followup with her primary care doctor for this.  #3 - ABNORMAL THYROID FUNCTION TESTS:  A TSH was ordered and was found to be  low at 0.192.  Because of this, further thyroid studies were obtained.  A  free T4 was normal at 1.6.  T3 was elevated at 42.2.  An antimicrosomal  antibody was measured to be normal at 45.3.  The implication of these  laboratory results is unclear at this time.  It is recommended that the  patient will follow up with her primary care physician and have a full  thyroid panel repeated in four to six weeks.  There were no other findings on history or physical to suggest hyperthyroidism, with the exception  of  diarrhea, (her  pulse was 68), or any physical findings associated with  Graves' disease.   DISPOSITION:  The patient was discharged home with the above p.o. and PR  medicines.  For the most part she was tolerating clear liquids.  She said  that she was ready to go home.  This was a heightened request on her behalf  after a discussion that we would no longer give her IV narcotic medicines  for her pain control.  As above, she is to follow up with Dr. Jeannetta Nap at  [facility].                                               Wynonia Lawman    LH/MEDQ  D:  11/08/2001  T:  11/09/2001  Job:  [id number]   cc:   Buren Kos, M.D.  [address] [address]  [place], [place]  [address]  Fax: [phone]

## 2010-07-19 NOTE — Op Note (Signed)
NAME:  Sara Hayden, Sara Hayden                 ACCOUNT NO.:  [id number]   MEDICAL RECORD NO.:  [id number]          PATIENT TYPE:  AMB   LOCATION:  SDC                           FACILITY:  [facility]   PHYSICIAN:  Dineen Kid. Rana Snare, M.D.    DATE OF BIRTH:  April 25, 1970   DATE OF PROCEDURE:  12/05/2003  DATE OF DISCHARGE:                                 OPERATIVE REPORT   PREOPERATIVE DIAGNOSES:  1.  Right lower quadrant pain.  2.  Right ovarian cyst.   POSTOPERATIVE DIAGNOSES:  1.  Right lower quadrant pain.  2.  Right ovarian cyst.   PROCEDURE:  Laparoscopy with right salpingo-oophorectomy.   SURGEON:  Dineen Kid. Rana Snare, M.D.   ANESTHESIA:  General endotracheal.   INDICATIONS:  Ms. Speir is a 40 year old G2, P2 with worsening pelvic pain.  She had a hysterectomy done over a year ago, had much improvement of the  pain and over the last 2-3 months began experiencing more right lower  quadrant discomfort apparently on Vicodin on a regular basis and not able to  socially interact like she normally does because of the pain.  Ultrasound  suspicious only for small right ovarian cyst.  The patient desires  definitive surgical evaluation and treatment and planned either right  ovarian cystectomy or right salpingo-oophorectomy.  __________  was found at  the time of surgery.  The risks and benefits were discussed which include  but are not limited to risk of infection, bleeding, damage to bowel,  bladder, ureters, ovaries, possibility this may not alleviate the pain.  It  could worsen or recur.  She does give her informed consent and wishes to  proceed.   FINDINGS AT THE TIME OF SURGERY:  Normal-appearing anatomy and normal left  tube and ovary.  The right ovary does appear to be slightly enlarged.  No  adhesions are noted.  Cul-de-sac is within normal limits.  No adhesions or  endometriosis identified.  The appendix, gallbladder, and liver also  appeared to be normal.   DESCRIPTION OF PROCEDURE:  After  adequate analgesia, the patient was placed  in the dorsal lithotomy position.  She was sterilely prepped and draped.  The bladder was sterilely drained.  A sponge stick was placed with the  sponge in the vagina.  A 1 cm infraumbilical skin incision was made.  A  Veress needle was inserted.  The abdomen was insufflated at dullness to  percussion, 11 mm trocar was inserted and the above findings were noted.  A  5 mm trocar was inserted to the left of the midline, 2 fingerbreadths above  the pubic symphysis under direct visualization.  After careful examination  of the abdomen and pelvis, because there was no obvious source of the pain  other than a slightly enlarged right ovary, the decision was made to go  ahead and remove the right tube and ovary.  Bipolar cautery was used to  cauterize across the infundibulopelvic ligament with good cautery noted.  This was sharply dissected with Endoshears.  Care was taken to avoid the  underlying vessels and ureter.  The ovary was then morcellated and removed  through the trocar.  With careful reexamination, good hemostasis was  achieved.  No other pathology was noted.  Normal-appearing left tube and  ovary at this time.  The abdomen was then desufflated.  The intrapelvic skin  incision was closed with 0 Vicryl figure-of-eight for the fascia and a 3-0  Vicryl Rapide subcuticular suture.  The 5-mm site was closed with a  horizontal mattress and 3-0 Vicryl Rapide with good approximation, good  hemostasis.  The incisions were then injected with 0.25% Marcaine, 10 mL  total used.  Sponge stick was removed from the vagina.  The patient was  transferred to recovery room in stable condition.  The patient tolerated the  procedure well and was stable and transferred to recovery room.  Sponge and  instrument count was normal x3.  The patient received 1 g of Rocephin  preoperatively.   DISPOSITION:  The patient will be discharged home and will follow up in the   office in 2-3 weeks and on a prescription for Vicodin, #30.  I told her to  return for increased pain, fever, bleeding.      DCL/MEDQ  D:  12/05/2003  T:  12/05/2003  Job:  [id number]

## 2010-07-19 NOTE — Op Note (Signed)
[facility]  Patient:    Sara Hayden, Sara Hayden                        MRN: [id number] Proc. Date: 05/13/00 Adm. Date:  [id number] Attending:  Trevor Iha                           Operative Report  PREOPERATIVE DIAGNOSES:       1. Dyspareunia.                               2. Pelvic pain.                               3. Abnormal uterine bleeding.  POSTOPERATIVE DIAGNOSES:      1. Dyspareunia.                               2. Pelvic pain.                               3. Abnormal uterine bleeding.  PROCEDURE:                    Laparoscopically assisted vaginal hysterectomy.  SURGEON:                      Trevor Iha, M.D.  ASSISTANT:                    Raynald Kemp, M.D.  ANESTHESIA:                   General endotracheal.  ESTIMATED BLOOD LOSS:         200 cc.  INDICATIONS:                  The patient is a 40 year old G4, P2, A2 with worsening pelvic pain and dyspareunia unresponsive to conservative management including anti-inflammatory medications, oral contraceptive agents, laparoscopy or Lupron Depot.  She presents today for definitive surgical intervention.  Plan laparoscopically assisted vaginal hysterectomy.  The risks and benefits were discussed at length including but not limited to the risks of infection; bleeding; damage to ureters, tubes, ovaries, bowel or bladder. Also, the possibility that this may no alleviate the pain.  Also, the risks associated with blood transfusion.  The patient does give her informed consent.  FINDINGS AT THE TIME OF SURGERY:              Boggy appearing, mildly enlarged uterus. Otherwise normal appearing ovaries, tubes, appendix and liver.  DESCRIPTION OF PROCEDURE:     After adequate analgesia, the patient was placed in the dorsal lithotomy position.  She was sterilely prepped and draped.  The bladder was sterilely drained.  A Hulka tenaculum was placed in the cervix.  A 1 cm  infraumbilical skin incision was made.  A Veress needle was inserted. The abdomen was insufflated to dullness to percussion.  A 5 mm port was placed to the left of midline two fingerbreadths above the pubic symphysis.  A 5 mm trocar was inserted and the above findings were noted.  A 5 mm camera was inserted and a Ligasure instrument  was placed through the 10 mm scope.  It was placed across the utero-ovarian ligament on the right side, cauterized and cut down across the round ligament.  This was performed similarly on the left side with good thermal burn and good hemostasis achieved on both sides, with the right and left fallopian tube falling to the pelvic side walls and good hemostasis achieved.  This was performed without complications.  At this time, the abdomen was desufflated, the legs were elevated and the vaginal portion of the procedure was performed.  Posterior colpotomy was performed.  The cervix was circumscribed with Bovie cautery.  The Ligasure instrument was used across the uterosacral ligaments bilaterally with good hemostasis.  It was then cut. Bladder pillars were then sealed with a Ligasure instrument and cut with good hemostasis.  At this time, the bladder was dissected off the anterior surface of the cervix with mild difficulty getting the bladder dissected off.  At this time, the patient was given indigo carmine IV.  After the bladder pillars were clamped and cauterized bilaterally and ligated, successive bites up across the cardinal ligaments and up across the lower portion of the broad ligaments were performed with the Ligasure instrument and Mayo scissors with good hemostasis achieved.  At this time, the fundus of the uterus was grasped with a thyroid tenaculum and delivered into the introitus.  The remaining portions of the pedicles were ligated with the Ligasure instrument and cut.  Again, good hemostasis was achieved.  The uterus was removed.  At this time, no  leaking from the bladder or dye was noted.  At this time, the uterosacral ligaments were grasped bilaterally.  They were suture ligated with 0 Monocryl suture. The posterior peritoneum was then closed with 0 Monocryl in a pursestring fashion.  The vagina was closed in a vertical fashion with 0 Monocryl sutures in a figure-of-eight fashion with good approximation and good hemostasis.  At this time, a Foley catheter was placed in the bladder with blue urine noted from the indigo carmine.  Again, no dye was noted in the vagina or the cul-de-sac.  Laparoscopy was performed and some mild areas of peritoneal bleeding were noted and were cauterized with bipolar cautery.  Irrigation was performed.  After adequate hemostasis was assured and reexamination of all pedicles for good hemostasis, the trocars were removed and the abdomen was desufflated.  The infraumbilical skin incision was closed with 0 Vicryl in the fascia and a 3-0 Vicryl Rapide subcuticular stitch in the skin and the 5 mm port with good approximation and good hemostasis.  The incisions were injected with 0.25% Marcaine.  The patient tolerated the procedure well and was stable on transfer to the recovery room.  Sponge, needle and instrument counts was normal x 3.  Estimated blood loss for the procedure was 200 cc. DD:  05/13/00 TD:  05/13/00 Job: [id number]

## 2010-07-19 NOTE — H&P (Signed)
NAME:  Sara Hayden, Sara Hayden                           ACCOUNT NO.:  [id number]   MEDICAL RECORD NO.:  [id number]                   PATIENT TYPE:  INP   LOCATION:  5727                                 FACILITY:  [facility]   PHYSICIAN:  Wayne A. Sheffield Slider, M.D.                 DATE OF BIRTH:  [birth year]   DATE OF ADMISSION:  11/05/2001  DATE OF DISCHARGE:                                HISTORY & PHYSICAL   CHIEF COMPLAINT:  Nausea, vomiting, and abdominal pain.   HISTORY OF PRESENT ILLNESS:  The patient is a 40 year old white female who  was discharged from [facility] last evening after a 2-1/2 day  hospital stay for intractable emesis and nausea.  Initial plain films of the  abdomen showed partial ileus versus small-bowel obstruction.  Repeat films  showed no abnormalities.  Labs were normal except for hypokalemia which was  corrected by the time of discharge.  Today she states that she went home  after eating dinner in the hospital, and awoke with emesis and abdominal  pain at 3:30 a.m.  She continued to vomit throughout the morning and then  presented to [facility] Emergency Department.   REVIEW OF SYSTEMS:  No fevers.  No vaginal discharge or bleeding.  One  episode of diarrhea.  No rashes.   PAST MEDICAL HISTORY:  1. Low transverse C-section in 1994.  2. Vaginal hysterectomy in March 2002 (her ovaries were spared).  3. Chronic back pain with surgeries in 2002 and 2003.  4. Exploratory laparotomy for endometriosis in March 2002.   MEDICATION:  Percocet given at discharge.   ALLERGIES:  MAGNESIUM SULFATE and DARVOCET.   SOCIAL HISTORY:  She lives with her husband and two sons.  She admits to  tobacco use; no alcohol or street drugs.  She is a [employer] trader.   FAMILY HISTORY:  Her mother has fibromyalgia and peptic ulcer disease and  gastroesophageal reflux disease.  Her father has asthma, emphysema, and  hypertension.   OBJECTIVE:  VITAL SIGNS: Temperature 99.5, heart rate 115,  blood pressure  119/77, heart rate 24.  She is saturating 99% on room air.  GENERAL: She is in mild distress and sits up with waves of nausea but she is  alert.  HEENT: Pupils are equal, round, reactive to light.  Oropharynx is clear with  poor dentition and she has moist mucous membranes.  NECK: No lymphadenopathy, no thyromegaly.  CARDIOVASCULAR: Tachycardia; no murmurs, rubs, or gallops.  Extremities are  warm.  LUNGS: Clear bilaterally; no wheeze or crackle.  ABDOMEN: Decreased bowel sounds with normal pitch, soft, generalized  tenderness; no rebound.  PELVIC: Exam last evening showed adnexal tenderness.  EXTREMITIES: No clubbing, cyanosis, or edema.  SKIN: No rashes.  She does have a tattoo on her ankle.   LABORATORIES:  Lipase was 18.  Sodium 137, potassium 3.0, chloride 102,  bicarb 24, BUN  4, creatinine 0.7, glucose 112, calcium 9.3, alkaline  phosphatase 67, albumin 4.1, AST 23, ALT 15, bilirubin 1.2.  Urinalysis was  negative.  Urine pregnancy was negative.   ASSESSMENT AND PLAN:  The patient is a 40 year old with intractable emesis  and abdominal pain.   1. Gastrointestinal.  Etiology is uncertain but includes reoccurred     ileus/small-bowel obstruction, appendicitis, cholecystitis, pancreatitis,     gastroenteritis, endometriosis, diverticulitis, and drug-seeking     behavior.  Plan is to give Reglan and Phenergan for nausea and vomiting.     We will check an abdominopelvic CT.  Her pelvis ultrasound and KUB were     done yesterday which showed normal findings.  We will give her Tylenol     for pain.  There is increased concern with the latter etiology since the     patient is begging for pain medications and Dilaudid, Zofran, and     Phenergan were just administered approximately 2 hours ago.  No narcotics     will be given until her CT results are returned.  2. Fluids, electrolytes, and nutrition.  We will give intravenous fluids     with potassium.  We will check a  BMP in the morning to follow her     potassium.     Lisa A. Rodman Pickle, M.D.                     Wayne A. Sheffield Slider, M.D.    LAC/MEDQ  D:  11/05/2001  T:  11/07/2001  Job:  [id number]   cc:   [facility]  [address]  [place], [place] [address]

## 2010-07-19 NOTE — Op Note (Signed)
[facility]  Patient:    Sara Hayden, Sara Hayden                          MRN: [id number] Proc. Date: 12/05/99 Adm. Date:  [id number] Attending:  Trevor Iha                           Operative Report  PREOPERATIVE DIAGNOSIS:  Pelvic pain.  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  OPERATION:  Laparoscopy with lysis of adhesions.  SURGEON:  Trevor Iha, M.D.  ANESTHESIA:  General endotracheal.  ESTIMATED BLOOD LOSS:  20 cc.  INDICATIONS:  The patient is a 40 year old G4, P2 with worsening pelvic pain not responding to conservative medical management including oral contraceptive agents, anti-inflammatory medications.  Patient desire definitive surgical intervention and evaluation.  PLAN:  Laparoscopy.  Risks and benefits were discussed at length.  Informed consent was obtained.  See history and physical for other details.  FINDINGS AT TIME OF SURGERY:  Included slightly enlarged and boggy-appearing uterus, normal-appearing ovaries bilaterally.  The left tube is adhesed to the pelvic side wall and also to the descending colon which was adhesed to the left side wall.  Otherwise normal-appearing ovarian fossa, normal-appearing cul-de-sac, bladder, appendix and liver.  The gallbladder was not visualized.  DESCRIPTION OF PROCEDURE:  After adequate analgesia, the patient was placed in dorsal lithotomy position.  She was sterilely prepped and draped.  Bladder was sterilely drained.  Hulka tenaculum was placed on the anterior lip of the cervix.  A 1 cm infraumbilical skin incision was made.  A Veress needle was inserted.  The abdomen was insufflated to dullness to percussion.  An 11 mm trocar was inserted and the above findings were noted by laparoscope.  A 5 mm port was placed to the left of midline two fingerbreadths above the pubic symphysis under direct visualization.  After the above findings were  noted sharp endoshears were used sharply dissect the adhesions from the colon and omentum from the left abdominal side wall and from teh left tube.  Hemostasis was achieved with bipolar cautery.  Care was taken to avoid the bowel and alsoclock major blood vessels.  Good hemostasis was achieved.  After the left tube was freed from the colonic adhesions and the colon was removed from the left side wall, re-examination revealed good hemostasis.  Examination of the abdominal contents revealed again the above findings, so at this time the laparoscope was removed.  The abdomen was desufflated, the trocars removed, the infaumbilical skin incision was closed with a 0 Vicryl in the fascia.  The incisions were closed with Dermabond with good approximation and good hemostasis.  They were injected with 0.25% Marcaine.  The tenaculum was removed from the cervix.  Hemostasis was achieved with direct pressure.  After good hemostasis, the patient was transferred to the recovery room in good condition.  Estimated blood loss for this procedure was 20 cc.  DISPOSITION:  The patient will be discharged home to follow up in the office in two to three weeks.  Sent home with a prescription for Vicodin.  She also has a prescription for Vioxx and ibuprofen at home.  She was instructed to return for increased pain, bleeding, fever and sent home with a routine instruction sheet for laparoscopy. DD:  12/05/99 TD:  12/05/99 Job: [id number]

## 2010-07-19 NOTE — Discharge Summary (Signed)
[facility]  Patient:    SHAILENE, DEMONBREUN                        MRN: [id number] Adm. Date:  [id number] Disc. Date: 05/15/00 Attending:  Trevor Iha                           Discharge Summary  HISTORY OF PRESENT ILLNESS:   Ms. Pietsch is a 40 year old G4, P2, A2, with worsening pelvic pain, dyspareunia, not responsive to conservative medical management including anti-inflammatory medications, oral contraceptive agent, laparoscopy, or Lupron Depot. She presents for definitive surgical intervention and planned laparoscopic vaginal hysterectomy.  HOSPITAL COURSE:              The patient was admitted and underwent a laparoscopically assisted vaginal hysterectomy. The surgery was uncomplicated. The estimated blood loss at time of surgery was 200 cc. Her postoperative course was complicated by nausea, vomiting at the end of postoperative day #1. Her postoperative hemoglobin on day #1 was 11.2. She had begun a regular diet and was doing well until mid afternoon when she began nausea and vomiting and was unable to keep anything down. She was placed on IV fluids through the night and antiemetics. On postoperative day #2 she was tolerating a regular diet without difficulty, no nausea or vomiting. Her physical exam was within normal limits with normal active bowel sounds and incision looking normal and was discharged home.  DISPOSITION:                  Patient is to be followed up in the office in two weeks.  DISCHARGE MEDICATIONS:        She was sent home with a prescription for Tylox, #30, and Motrin 800 mg, #20.  DISCHARGE CONDITION:          Good. DD:  05/15/00 TD:  05/15/00 Job: [id number]

## 2013-05-20 ENCOUNTER — Encounter (HOSPITAL_COMMUNITY): Payer: Self-pay | Admitting: Emergency Medicine

## 2013-05-20 ENCOUNTER — Emergency Department (HOSPITAL_COMMUNITY)
Admission: EM | Admit: 2013-05-20 | Discharge: 2013-05-20 | Disposition: A | Discharge status: Home / Self Care | Payer: Self-pay | Attending: Emergency Medicine | Admitting: Emergency Medicine

## 2013-05-20 ENCOUNTER — Emergency Department (HOSPITAL_COMMUNITY): Payer: Self-pay

## 2013-05-20 DIAGNOSIS — F17200 Nicotine dependence, unspecified, uncomplicated: Secondary | ICD-10-CM | POA: Insufficient documentation

## 2013-05-20 DIAGNOSIS — Z79899 Other long term (current) drug therapy: Secondary | ICD-10-CM | POA: Insufficient documentation

## 2013-05-20 DIAGNOSIS — G8929 Other chronic pain: Secondary | ICD-10-CM | POA: Insufficient documentation

## 2013-05-20 DIAGNOSIS — M549 Dorsalgia, unspecified: Secondary | ICD-10-CM | POA: Insufficient documentation

## 2013-05-20 DIAGNOSIS — R509 Fever, unspecified: Secondary | ICD-10-CM | POA: Insufficient documentation

## 2013-05-20 DIAGNOSIS — R000 Tachycardia, unspecified: Secondary | ICD-10-CM | POA: Insufficient documentation

## 2013-05-20 DIAGNOSIS — R630 Anorexia: Secondary | ICD-10-CM | POA: Insufficient documentation

## 2013-05-20 DIAGNOSIS — R112 Nausea with vomiting, unspecified: Secondary | ICD-10-CM

## 2013-05-20 DIAGNOSIS — E860 Dehydration: Secondary | ICD-10-CM | POA: Insufficient documentation

## 2013-05-20 DIAGNOSIS — R197 Diarrhea, unspecified: Secondary | ICD-10-CM | POA: Insufficient documentation

## 2013-05-20 DIAGNOSIS — K920 Hematemesis: Secondary | ICD-10-CM | POA: Insufficient documentation

## 2013-05-20 LAB — COMPREHENSIVE METABOLIC PANEL
ALT: 16 U/L (ref 0–35)
AST: 23 U/L (ref 0–37)
Albumin: 4.8 g/dL (ref 3.5–5.2)
Alkaline Phosphatase: 85 U/L (ref 39–117)
BUN: 10 mg/dL (ref 6–23)
CHLORIDE: 97 meq/L (ref 96–112)
CO2: 26 meq/L (ref 19–32)
Calcium: 10.5 mg/dL (ref 8.4–10.5)
Creatinine, Ser: 0.52 mg/dL (ref 0.50–1.10)
GFR calc Af Amer: >90 mL/min (ref >90)
GFR calc non Af Amer: >90 mL/min (ref >90)
Glucose, Bld: 171 mg/dL — ABNORMAL HIGH (ref 70–99)
Potassium: 3.6 mEq/L — ABNORMAL LOW (ref 3.7–5.3)
SODIUM: 141 meq/L (ref 137–147)
Total Bilirubin: 0.5 mg/dL (ref 0.3–1.2)
Total Protein: 8.9 g/dL — ABNORMAL HIGH (ref 6.0–8.3)

## 2013-05-20 LAB — URINALYSIS, ROUTINE W REFLEX MICROSCOPIC
Bilirubin Urine: NEGATIVE
GLUCOSE, UA: 250 mg/dL — AB
Hgb urine dipstick: NEGATIVE
KETONES UR: 40 mg/dL — AB
Leukocytes, UA: NEGATIVE
Nitrite: NEGATIVE
Protein, ur: 30 mg/dL — AB
SPECIFIC GRAVITY, URINE: 1.021 (ref 1.005–1.030)
UROBILINOGEN UA: 1 mg/dL (ref 0.0–1.0)
pH: 8 (ref 5.0–8.0)

## 2013-05-20 LAB — CBC WITH DIFFERENTIAL/PLATELET
BASOS PCT: 0 % (ref 0–1)
Basophils Absolute: 0 10*3/uL (ref 0.0–0.1)
EOS ABS: 0 10*3/uL (ref 0.0–0.7)
EOS PCT: 0 % (ref 0–5)
HCT: 46.8 % — ABNORMAL HIGH (ref 36.0–46.0)
HEMOGLOBIN: 17.1 g/dL — AB (ref 12.0–15.0)
LYMPHS PCT: 6 % — AB (ref 12–46)
Lymphs Abs: 1 10*3/uL (ref 0.7–4.0)
MCH: 31.6 pg (ref 26.0–34.0)
MCHC: 36.5 g/dL — ABNORMAL HIGH (ref 30.0–36.0)
MCV: 86.5 fL (ref 78.0–100.0)
MONO ABS: 0.7 10*3/uL (ref 0.1–1.0)
Monocytes Relative: 4 % (ref 3–12)
Neutro Abs: 15.6 10*3/uL — ABNORMAL HIGH (ref 1.7–7.7)
Neutrophils Relative %: 91 % — ABNORMAL HIGH (ref 43–77)
PLATELETS: 354 10*3/uL (ref 150–400)
RBC: 5.41 MIL/uL — AB (ref 3.87–5.11)
RDW: 13.3 % (ref 11.5–15.5)
WBC: 17.2 10*3/uL — ABNORMAL HIGH (ref 4.0–10.5)

## 2013-05-20 LAB — URINE MICROSCOPIC-ADD ON

## 2013-05-20 LAB — LIPASE, BLOOD: LIPASE: 10 U/L — AB (ref 11–59)

## 2013-05-20 MED ORDER — IOHEXOL 300 MG/ML  SOLN
100.0000 mL | Freq: Once | INTRAMUSCULAR | Status: AC | PRN
  Administered 2013-05-20: 100 mL via INTRAVENOUS

## 2013-05-20 MED ORDER — IOHEXOL 300 MG/ML  SOLN
25.0000 mL | INTRAMUSCULAR | Status: AC
  Administered 2013-05-20: 25 mL via ORAL

## 2013-05-20 MED ORDER — ONDANSETRON HCL 4 MG/2ML IJ SOLN
4.0000 mg | Freq: Once | INTRAMUSCULAR | Status: AC
  Administered 2013-05-20: 4 mg via INTRAVENOUS
  Filled 2013-05-20: qty 2

## 2013-05-20 MED ORDER — PROMETHAZINE HCL 25 MG PO TABS: 25.0000 mg | ORAL_TABLET | Freq: Four times a day (QID) | ORAL | Status: DC | PRN

## 2013-05-20 MED ORDER — MORPHINE SULFATE 4 MG/ML IJ SOLN
4.0000 mg | Freq: Once | INTRAMUSCULAR | Status: AC
  Administered 2013-05-20: 4 mg via INTRAVENOUS
  Filled 2013-05-20: qty 1

## 2013-05-20 MED ORDER — PROMETHAZINE HCL 25 MG/ML IJ SOLN
25.0000 mg | Freq: Once | INTRAMUSCULAR | Status: AC
  Administered 2013-05-20: 25 mg via INTRAVENOUS
  Filled 2013-05-20: qty 1

## 2013-05-20 MED ORDER — SODIUM CHLORIDE 0.9 % IV BOLUS (SEPSIS)
1000.0000 mL | Freq: Once | INTRAVENOUS | Status: AC
Start: 2013-05-20 — End: 2013-05-20
  Administered 2013-05-20: 1000 mL via INTRAVENOUS

## 2013-05-20 NOTE — ED Notes (Signed)
Pt requesting methadone for pain, sts she takes it everyday for her back pain/opiate abuse. MD made aware.

## 2013-05-20 NOTE — ED Provider Notes (Signed)
CSN: [id number]     Arrival date & time 05/20/13  0712 History   First MD Initiated Contact with Patient 05/20/13 [phone]     Chief Complaint  Patient presents with  . Hematemesis     (Consider location/radiation/quality/duration/timing/severity/associated sxs/prior Treatment) The history is provided by the patient.   patient presents with nausea vomiting abdominal pain. It began yesterday. She states last night she began to vomit blood also. She states she's had some diarrhea. She has upper abdominal pain is constant. She states she is on methadone for opiate abuse and chronic pain and that she was not able to get it this morning because they sent her here because she been throwing up blood. She denies fevers, but has had chills. She states she is not able to keep any food down. She states she has a lot of abdominal problems. She states she occasionally will take Motrin, but not frequently. She states that she throws up every day. He states this has been going on for a while. She's never had EGD or colonoscopy.  History reviewed. No pertinent past medical history. Past Surgical History  Procedure Laterality Date  . Abdominal hysterectomy     No family history on file. History  Substance Use Topics  . Smoking status: Current Every Day Smoker  . Smokeless tobacco: Not on file  . Alcohol Use: No   OB History   Grav Para Term Preterm Abortions TAB SAB Ect Mult Living                 Review of Systems  Constitutional: Positive for chills and appetite change. Negative for activity change.  Eyes: Negative for pain.  Respiratory: Negative for chest tightness and shortness of breath.   Cardiovascular: Negative for chest pain and leg swelling.  Gastrointestinal: Positive for nausea, vomiting, abdominal pain and diarrhea. Negative for blood in stool.  Genitourinary: Negative for flank pain.  Musculoskeletal: Positive for back pain. Negative for neck stiffness.  Skin: Negative for rash.   Neurological: Negative for weakness, numbness and headaches.  Psychiatric/Behavioral: Negative for behavioral problems.      Allergies  Magnesium-containing compounds and Other  Home Medications   Current Outpatient Rx  Name  Route  Sig  Dispense  Refill  . Calcium Carbonate Antacid (ANTACID PO)   Oral   Take 1 tablet by mouth daily.         Marland Kitchen. ibuprofen (ADVIL,MOTRIN) 200 MG tablet   Oral   Take 400 mg by mouth every 6 (six) hours as needed for mild pain.         . methadone (DOLOPHINE) 10 MG/5ML solution   Oral   Take 8.4 mg by mouth daily.         . promethazine (PHENERGAN) 25 MG tablet   Oral   Take 1 tablet (25 mg total) by mouth every 6 (six) hours as needed for nausea.   20 tablet   0    BP 104/60  Pulse 84  Temp(Src) 100.7 F (38.2 C) (Rectal)  Resp 16  Ht 5\' 2"  (1.575 m)  Wt 110 lb (49.896 kg)  BMI 20.11 kg/m2  SpO2 97% Physical Exam  Nursing note and vitals reviewed. Constitutional: She is oriented to person, place, and time. She appears well-developed.  Patient appears to have chills  HENT:  Head: Normocephalic and atraumatic.  Cardiovascular:  Mild tachycardia  Pulmonary/Chest: Effort normal and breath sounds normal.  Abdominal: Soft. There is tenderness.  Moderate epigastric tenderness with some  mild guarding.  Musculoskeletal: Normal range of motion. She exhibits no edema.  Neurological: She is alert and oriented to person, place, and time.  Skin: Skin is warm.    ED Course  Procedures (including critical care time) Labs Review Labs Reviewed  CBC WITH DIFFERENTIAL - Abnormal; Notable for the following:    WBC 17.2 (*)    RBC 5.41 (*)    Hemoglobin 17.1 (*)    HCT 46.8 (*)    MCHC 36.5 (*)    Neutrophils Relative % 91 (*)    Neutro Abs 15.6 (*)    Lymphocytes Relative 6 (*)    All other components within normal limits  COMPREHENSIVE METABOLIC PANEL - Abnormal; Notable for the following:    Potassium 3.6 (*)    Glucose, Bld  171 (*)    Total Protein 8.9 (*)    All other components within normal limits  LIPASE, BLOOD - Abnormal; Notable for the following:    Lipase 10 (*)    All other components within normal limits  URINALYSIS, ROUTINE W REFLEX MICROSCOPIC - Abnormal; Notable for the following:    Glucose, UA 250 (*)    Ketones, ur 40 (*)    Protein, ur 30 (*)    All other components within normal limits  URINE MICROSCOPIC-ADD ON - Abnormal; Notable for the following:    Squamous Epithelial / LPF FEW (*)    All other components within normal limits   Imaging Review Ct Abdomen Pelvis W Contrast  05/20/2013   ADDENDUM REPORT: 05/20/2013 13:24  ADDENDUM: While reviewing the case with the clinician, it was noted through the common bile duct is mildly prominent, measuring up to approximately 10 mm on image 31. I see no distal obstructing lesion or stone. LFTs and bilirubin are reportedly normal. If further evaluation is felt warranted, MRCP may be beneficial.   Electronically Signed   By: Charlett Nose M.D.   On: 05/20/2013 13:24   05/20/2013   CLINICAL DATA:  Upper abdominal pain.  Hematemesis.  EXAM: CT ABDOMEN AND PELVIS WITH CONTRAST  TECHNIQUE: Multidetector CT imaging of the abdomen and pelvis was performed using the standard protocol following bolus administration of intravenous contrast.  CONTRAST:  OMNIPAQUE IOHEXOL 300 MG/ML  SOLN  COMPARISON:  DG ACUTE ABDOMEN SERIES dated 01/19/2013; CT-ABDOMEN AND PELVIS W/O CONTRAST dated 06/15/2012  FINDINGS: Lung bases are clear. No effusions. Heart is normal size.  Liver, gallbladder, spleen, pancreas, adrenals and kidneys are unremarkable. Stomach, large and small bowel have a normal appearance. No free fluid, free air or adenopathy.  Prior hysterectomy. No adnexal masses. No acute bony abnormality. Degenerative disc disease at L5-S1.  IMPRESSION: No acute findings in the abdomen or pelvis.  Electronically Signed: By: Charlett Nose M.D. On: 05/20/2013 13:08     EKG  Interpretation None      MDM   Final diagnoses:  Hematemesis  Dehydration  Nausea and vomiting   Patient with acute on chronic nausea and vomiting. Has had fevers. She has upper abdominal tenderness. Lab work shows an elevated white count and elevated hemoglobin. Liver function and lipase are reassuring. CT scan done and showed some dilatation of common bile duct. There is no signs of obstruction on the labs. Patient feels better after treatment. She is tolerated orals and is eager to go home. She'll be given a prescription for Phenergan and will followup with [facility] gastroenterology. sHe does not appear septic at this time.    Juliet Rude. Rubin Payor,  MD 05/20/13 1620

## 2013-05-20 NOTE — ED Notes (Signed)
Pt able to take another sip of oral CT contrast but still saying she is unable to drink the entire cup. MD made aware, sts to notify CT that they can scan her without oral contrast. [facility]

## 2013-05-20 NOTE — ED Notes (Signed)
Pt sts she vomits once every morning, unsure what causes it and has never been seen for it before. Pt also reports having been seen once and told that she has a possible lymphoma around right armpit, sts she was supposed to follow up but never did because she doesn't have insurance. No swelling seen at site. Pt sts slight pain in that area sometimes but not constant.

## 2013-05-20 NOTE — ED Notes (Addendum)
Pt c/o nausea and vomiting dark red blood since last night, sts later on she had diffuse abd pain. Denies diarrhea. sts she has had a temp of 100 at home. Denies use of blood thinners/blood in stool. Denies hx of ulcers. Nad, skin warm and dry, resp e/u. Pt appears pale. Pt is not actively vomiting at this time.

## 2013-05-20 NOTE — ED Notes (Signed)
Pt not given oral contrast yet. Attempted to call CT to see what the hold up is. CT phone busy. Will call back shortly.

## 2013-05-20 NOTE — ED Notes (Signed)
Pt requesting more pain medicine 

## 2013-05-20 NOTE — ED Notes (Signed)
Pt sts she is unable to drink the oral CT contrast without throwing it back up. Pt has only taken 2 sip, no vomiting seen at the moment. Pt encouraged to wait a few minutes and try again with small sips.

## 2013-05-20 NOTE — ED Notes (Signed)
Pt reports vomiting dark red blood since last night. Pt reports vomiting since 1400 yesterday. Pt reports unable to tolerate PO food or fluids.

## 2013-05-20 NOTE — Discharge Instructions (Signed)
Dehydration, Adult Dehydration is when you lose more fluids from the body than you take in. Vital organs like the kidneys, brain, and heart cannot function without a proper amount of fluids and salt. Any loss of fluids from the body can cause dehydration.  CAUSES   Vomiting.  Diarrhea.  Excessive sweating.  Excessive urine output.  Fever. SYMPTOMS  Mild dehydration  Thirst.  Dry lips.  Slightly dry mouth. Moderate dehydration  Very dry mouth.  Sunken eyes.  Skin does not bounce back quickly when lightly pinched and released.  Dark urine and decreased urine production.  Decreased tear production.  Headache. Severe dehydration  Very dry mouth.  Extreme thirst.  Rapid, weak pulse (more than 100 beats per minute at rest).  Cold hands and feet.  Not able to sweat in spite of heat and temperature.  Rapid breathing.  Blue lips.  Confusion and lethargy.  Difficulty being awakened.  Minimal urine production.  No tears. DIAGNOSIS  Your caregiver will diagnose dehydration based on your symptoms and your exam. Blood and urine tests will help confirm the diagnosis. The diagnostic evaluation should also identify the cause of dehydration. TREATMENT  Treatment of mild or moderate dehydration can often be done at home by increasing the amount of fluids that you drink. It is best to drink small amounts of fluid more often. Drinking too much at one time can make vomiting worse. Refer to the home care instructions below. Severe dehydration needs to be treated at the hospital where you will probably be given intravenous (IV) fluids that contain water and electrolytes. HOME CARE INSTRUCTIONS   Ask your caregiver about specific rehydration instructions.  Drink enough fluids to keep your urine clear or pale yellow.  Drink small amounts frequently if you have nausea and vomiting.  Eat as you normally do.  Avoid:  Foods or drinks high in sugar.  Carbonated  drinks.  Juice.  Extremely hot or cold fluids.  Drinks with caffeine.  Fatty, greasy foods.  Alcohol.  Tobacco.  Overeating.  Gelatin desserts.  Wash your hands well to avoid spreading bacteria and viruses.  Only take over-the-counter or prescription medicines for pain, discomfort, or fever as directed by your caregiver.  Ask your caregiver if you should continue all prescribed and over-the-counter medicines.  Keep all follow-up appointments with your caregiver. SEEK MEDICAL CARE IF:  You have abdominal pain and it increases or stays in one area (localizes).  You have a rash, stiff neck, or severe headache.  You are irritable, sleepy, or difficult to awaken.  You are weak, dizzy, or extremely thirsty. SEEK IMMEDIATE MEDICAL CARE IF:   You are unable to keep fluids down or you get worse despite treatment.  You have frequent episodes of vomiting or diarrhea.  You have blood or green matter (bile) in your vomit.  You have blood in your stool or your stool looks black and tarry.  You have not urinated in 6 to 8 hours, or you have only urinated a small amount of very dark urine.  You have a fever.  You faint. MAKE SURE YOU:   Understand these instructions.  Will watch your condition.  Will get help right away if you are not doing well or get worse. Document Released: 02/17/2005 Document Revised: 05/12/2011 Document Reviewed: 10/07/2010 [facility] Patient Information 2014 [place], [place].  Hematemesis This condition is the vomiting of blood. CAUSES  This can happen if you have a peptic ulcer or an irritation of the throat, stomach, or  small bowel. Vomiting over and over again or swallowing blood from a nosebleed, coughing or facial injury can also result in bloody vomit. Anti-inflammatory pain medicines are a common cause of this potentially dangerous condition. The most serious causes of vomiting blood include:  Ulcers (a bacteria called H. pylori is common  cause of ulcers).  Clotting problems.  Alcoholism.  Cirrhosis. TREATMENT  Treatment depends on the cause and the severity of the bleeding. Small amounts of blood streaks in the vomit is not the same as vomiting large amounts of bloody or dark, coffee grounds-like material. Weakness, fainting, dehydration, anemia, and continued alcohol or drug use increase the risk. Examination may include blood, vomit, or stool tests. The presence of bloody or dark stool that tests positive for blood (Hemoccult) means the bleeding has been going on for some time. Endoscopy and imaging studies may be done. Emergency treatment may include:  IV medicines or fluids.  Blood transfusions.  Surgery. Hospital care is required for high risk patients or when IV fluids or blood is needed. Upper GI bleeding can cause shock and death if not controlled. HOME CARE INSTRUCTIONS   Your treatment does not require hospital care at this time.  Remain at rest until your condition improves.  Drink clear liquids as tolerated.  Avoid:  Alcohol.  Nicotine.  Aspirin.  Any other anti-inflammatory medicine (ibuprofen, naproxen, and many others).  Medications to suppress stomach acid or vomiting may be needed. Take all your medicine as prescribed.  Be sure to see your caregiver for follow-up as recommended. SEEK IMMEDIATE MEDICAL CARE IF:   You have repeated vomiting, dehydration, fainting, or extreme weakness.  You are vomiting large amounts of bloody or dark material.  You pass large, dark or bloody stools. Document Released: 03/27/2004 Document Revised: 05/12/2011 Document Reviewed: 04/12/2008 [facility] Patient Information 2014 [place].  Nausea and Vomiting Nausea is a sick feeling that often comes before throwing up (vomiting). Vomiting is a reflex where stomach contents come out of your mouth. Vomiting can cause severe loss of body fluids (dehydration). Children and elderly adults can become dehydrated  quickly, especially if they also have diarrhea. Nausea and vomiting are symptoms of a condition or disease. It is important to find the cause of your symptoms. CAUSES   Direct irritation of the stomach lining. This irritation can result from increased acid production (gastroesophageal reflux disease), infection, food poisoning, taking certain medicines (such as nonsteroidal anti-inflammatory drugs), alcohol use, or tobacco use.  Signals from the brain.These signals could be caused by a headache, heat exposure, an inner ear disturbance, increased pressure in the brain from injury, infection, a tumor, or a concussion, pain, emotional stimulus, or metabolic problems.  An obstruction in the gastrointestinal tract (bowel obstruction).  Illnesses such as diabetes, hepatitis, gallbladder problems, appendicitis, kidney problems, cancer, sepsis, atypical symptoms of a heart attack, or eating disorders.  Medical treatments such as chemotherapy and radiation.  Receiving medicine that makes you sleep (general anesthetic) during surgery. DIAGNOSIS Your caregiver may ask for tests to be done if the problems do not improve after a few days. Tests may also be done if symptoms are severe or if the reason for the nausea and vomiting is not clear. Tests may include:  Urine tests.  Blood tests.  Stool tests.  Cultures (to look for evidence of infection).  X-rays or other imaging studies. Test results can help your caregiver make decisions about treatment or the need for additional tests. TREATMENT You need to stay well  hydrated. Drink frequently but in small amounts.You may wish to drink water, sports drinks, clear broth, or eat frozen ice pops or gelatin dessert to help stay hydrated.When you eat, eating slowly may help prevent nausea.There are also some antinausea medicines that may help prevent nausea. HOME CARE INSTRUCTIONS   Take all medicine as directed by your caregiver.  If you do not have  an appetite, do not force yourself to eat. However, you must continue to drink fluids.  If you have an appetite, eat a normal diet unless your caregiver tells you differently.  Eat a variety of complex carbohydrates (rice, wheat, potatoes, bread), lean meats, yogurt, fruits, and vegetables.  Avoid high-fat foods because they are more difficult to digest.  Drink enough water and fluids to keep your urine clear or pale yellow.  If you are dehydrated, ask your caregiver for specific rehydration instructions. Signs of dehydration may include:  Severe thirst.  Dry lips and mouth.  Dizziness.  Dark urine.  Decreasing urine frequency and amount.  Confusion.  Rapid breathing or pulse. SEEK IMMEDIATE MEDICAL CARE IF:   You have blood or brown flecks (like coffee grounds) in your vomit.  You have black or bloody stools.  You have a severe headache or stiff neck.  You are confused.  You have severe abdominal pain.  You have chest pain or trouble breathing.  You do not urinate at least once every 8 hours.  You develop cold or clammy skin.  You continue to vomit for longer than 24 to 48 hours.  You have a fever. MAKE SURE YOU:   Understand these instructions.  Will watch your condition.  Will get help right away if you are not doing well or get worse. Document Released: 02/17/2005 Document Revised: 05/12/2011 Document Reviewed: 07/17/2010 [facility] Patient Information 2014 [place], [place].

## 2013-05-20 NOTE — ED Notes (Signed)
Spoke with CT, they are going to figure it out.

## 2013-05-22 ENCOUNTER — Encounter (HOSPITAL_COMMUNITY): Payer: Self-pay | Admitting: Emergency Medicine

## 2013-05-22 ENCOUNTER — Emergency Department (HOSPITAL_COMMUNITY)
Admission: EM | Admit: 2013-05-22 | Discharge: 2013-05-22 | Disposition: A | Discharge status: Home / Self Care | Payer: Self-pay | Attending: Emergency Medicine | Admitting: Emergency Medicine

## 2013-05-22 DIAGNOSIS — Z90710 Acquired absence of both cervix and uterus: Secondary | ICD-10-CM | POA: Insufficient documentation

## 2013-05-22 DIAGNOSIS — R6883 Chills (without fever): Secondary | ICD-10-CM | POA: Insufficient documentation

## 2013-05-22 DIAGNOSIS — Z79899 Other long term (current) drug therapy: Secondary | ICD-10-CM | POA: Insufficient documentation

## 2013-05-22 DIAGNOSIS — R0602 Shortness of breath: Secondary | ICD-10-CM | POA: Insufficient documentation

## 2013-05-22 DIAGNOSIS — R1013 Epigastric pain: Secondary | ICD-10-CM

## 2013-05-22 DIAGNOSIS — R112 Nausea with vomiting, unspecified: Secondary | ICD-10-CM

## 2013-05-22 DIAGNOSIS — Z8742 Personal history of other diseases of the female genital tract: Secondary | ICD-10-CM | POA: Insufficient documentation

## 2013-05-22 DIAGNOSIS — R42 Dizziness and giddiness: Secondary | ICD-10-CM | POA: Insufficient documentation

## 2013-05-22 DIAGNOSIS — F17200 Nicotine dependence, unspecified, uncomplicated: Secondary | ICD-10-CM | POA: Insufficient documentation

## 2013-05-22 DIAGNOSIS — R1012 Left upper quadrant pain: Secondary | ICD-10-CM | POA: Insufficient documentation

## 2013-05-22 HISTORY — DX: Endometriosis, unspecified: N80.9

## 2013-05-22 LAB — COMPREHENSIVE METABOLIC PANEL
ALBUMIN: 3.8 g/dL (ref 3.5–5.2)
ALT: 10 U/L (ref 0–35)
AST: 14 U/L (ref 0–37)
Alkaline Phosphatase: 67 U/L (ref 39–117)
BUN: 12 mg/dL (ref 6–23)
CALCIUM: 9.4 mg/dL (ref 8.4–10.5)
CO2: 22 mEq/L (ref 19–32)
CREATININE: 0.51 mg/dL (ref 0.50–1.10)
Chloride: 100 mEq/L (ref 96–112)
GFR calc Af Amer: >90 mL/min (ref >90)
GFR calc non Af Amer: >90 mL/min (ref >90)
Glucose, Bld: 92 mg/dL (ref 70–99)
Potassium: 3.4 mEq/L — ABNORMAL LOW (ref 3.7–5.3)
Sodium: 141 mEq/L (ref 137–147)
TOTAL PROTEIN: 7.1 g/dL (ref 6.0–8.3)
Total Bilirubin: 0.6 mg/dL (ref 0.3–1.2)

## 2013-05-22 LAB — CBC WITH DIFFERENTIAL/PLATELET
BASOS PCT: 0 % (ref 0–1)
Basophils Absolute: 0 10*3/uL (ref 0.0–0.1)
EOS ABS: 0 10*3/uL (ref 0.0–0.7)
Eosinophils Relative: 0 % (ref 0–5)
HEMATOCRIT: 44 % (ref 36.0–46.0)
Hemoglobin: 15.8 g/dL — ABNORMAL HIGH (ref 12.0–15.0)
LYMPHS PCT: 18 % (ref 12–46)
Lymphs Abs: 2.2 10*3/uL (ref 0.7–4.0)
MCH: 31 pg (ref 26.0–34.0)
MCHC: 35.9 g/dL (ref 30.0–36.0)
MCV: 86.4 fL (ref 78.0–100.0)
MONOS PCT: 5 % (ref 3–12)
Monocytes Absolute: 0.6 10*3/uL (ref 0.1–1.0)
NEUTROS PCT: 77 % (ref 43–77)
Neutro Abs: 9.4 10*3/uL — ABNORMAL HIGH (ref 1.7–7.7)
PLATELETS: 280 10*3/uL (ref 150–400)
RBC: 5.09 MIL/uL (ref 3.87–5.11)
RDW: 12.9 % (ref 11.5–15.5)
WBC: 12.2 10*3/uL — ABNORMAL HIGH (ref 4.0–10.5)

## 2013-05-22 LAB — I-STAT TROPONIN, ED: TROPONIN I, POC: 0 ng/mL (ref 0.00–0.08)

## 2013-05-22 MED ORDER — LORAZEPAM 1 MG PO TABS: 1.0000 mg | ORAL_TABLET | Freq: Three times a day (TID) | ORAL | Status: AC

## 2013-05-22 MED ORDER — HYDROXYZINE HCL 25 MG PO TABS
50.0000 mg | ORAL_TABLET | Freq: Once | ORAL | Status: AC
  Administered 2013-05-22: 50 mg via ORAL
  Filled 2013-05-22: qty 2

## 2013-05-22 MED ORDER — OXYCODONE-ACETAMINOPHEN 5-325 MG PO TABS
1.0000 | ORAL_TABLET | Freq: Once | ORAL | Status: AC
  Administered 2013-05-22: 1 via ORAL
  Filled 2013-05-22: qty 1

## 2013-05-22 MED ORDER — LORAZEPAM 2 MG/ML IJ SOLN
1.0000 mg | Freq: Once | INTRAMUSCULAR | Status: AC
  Administered 2013-05-22: 1 mg via INTRAVENOUS
  Filled 2013-05-22: qty 1

## 2013-05-22 MED ORDER — HYDROXYZINE HCL 50 MG/ML IM SOLN
50.0000 mg | Freq: Once | INTRAMUSCULAR | Status: DC
  Filled 2013-05-22: qty 1

## 2013-05-22 MED ORDER — SODIUM CHLORIDE 0.9 % IV BOLUS (SEPSIS)
2000.0000 mL | Freq: Once | INTRAVENOUS | Status: AC
  Administered 2013-05-22: 2000 mL via INTRAVENOUS

## 2013-05-22 MED ORDER — LORAZEPAM 2 MG/ML IJ SOLN: 0.5000 mg | Freq: Once | INTRAMUSCULAR | Status: DC

## 2013-05-22 NOTE — ED Notes (Signed)
Pt c/o center chest pain onset some yesterday worse today. Pt also reports vomiting. Pt seen here 2 days ago for similar symptoms and was given the option to be admitted or go home and pt choose to go home thinking she would be ok. Visitor reports pt was suppose to have prescription for phenergan but when pt got home she realized she did not have the prescription.

## 2013-05-22 NOTE — ED Provider Notes (Signed)
4:58 PM Pt with chronic N/V, abd pain. Prolonged QT.   Asked by outgoing provider to check on patient after she received Percocet to make sure she didn't vomit.   Patient has tolerated medication well. Will d/c to home.   Renne CriglerJoshua Seichi Kaufhold, PA-C 05/22/13 1659

## 2013-05-22 NOTE — Discharge Instructions (Signed)

## 2013-05-22 NOTE — ED Provider Notes (Signed)
Medical screening examination/treatment/procedure(s) were conducted as a shared visit with non-physician practitioner(s) and myself.  I personally evaluated the patient during the encounter.  I saw and evaluated the patient, reviewed the resident's note and I agree with the findings and plan.   EKG Interpretation   Date/Time:  Sunday May 22 2013 13:07:34 EDT Ventricular Rate:  101 PR Interval:  128 QRS Duration: 78 QT Interval:  510 QTC Calculation: 661 R Axis:   71 Text Interpretation:  Sinus tachycardia ST \\T \ Marked T-wave abnormality,  consider inferolateral ischemia No previous ECGs available Confirmed by  [facility]  MD, Jonny RuizJOHN ([id number]) on 05/22/2013 2:17:24 PM        EKG Interpretation   Date/Time:  Sunday May 22 2013 13:07:34 EDT Ventricular Rate:  101 PR Interval:  128 QRS Duration: 78 QT Interval:  510 QTC Calculation: 661 R Axis:   71 Text Interpretation:  Sinus tachycardia ST \\T \ Marked T-wave abnormality,  consider inferolateral ischemia No previous ECGs available Confirmed by  [facility]  MD, Jonny RuizJOHN ([id number]) on 05/22/2013 2:17:24 PM      Chronic daily abdominal and nausea; exacerbation of pain/N/V few days with unremarkable CT scan within the last few days; abdomen is soft with minimal epigastric tenderness only with the rest of the abdomen nontender no rebound  Hurman HornJohn M Demonica Farrey, MD 05/23/13 1827

## 2013-05-22 NOTE — ED Provider Notes (Signed)
CSN: [id number]     Arrival date & time 05/22/13  1259 History   None    Chief Complaint  Patient presents with  . Chest Pain  . Emesis     (Consider location/radiation/quality/duration/timing/severity/associated sxs/prior Treatment) Patient is a 43 y.o. female presenting with chest pain and vomiting. The history is provided by the patient. No language interpreter was used.  Chest Pain Associated symptoms: abdominal pain, nausea, shortness of breath and vomiting   Associated symptoms: no back pain, no cough, no fever and no palpitations   Emesis Associated symptoms: abdominal pain and chills   Associated symptoms: no diarrhea    This is a 42yo WF w/ PMH endometriosis, opioid abuse on methadone, and chronic N/V who presents with c/o N/V and epigastric pain. Patient reports that she has chronic nausea and usually vomits once daily, but since last Thursday patient notes multiple episodes of N/V. She was evaluated in the ED on 3/20 at which time she had a leukocytosis (WBC 17.2) and low grade fever and CT abd/pelvis showing mildly prominent CBD without visible distal obstructive lesion. CMP and lipase at her last visit were unremarkable. She reports having continued N/V and reports she hasn't been able to keep down anything PO since Thursday. She developed epigastric pain after retching last night, which is constantly mild, but worsens with retching. She endorses some mild SOB and sweating, though these symptoms are not related to the chest pain. She denies dysuria, diarrhea, BLE edema, diarrhea. She had some hematemesis at her last visit on 3/20, but denies having any blood in her emesis since that time.  Past Medical History  Diagnosis Date  . Endometriosis    Past Surgical History  Procedure Laterality Date  . Abdominal hysterectomy     No family history on file. History  Substance Use Topics  . Smoking status: Current Every Day Smoker  . Smokeless tobacco: Not on file  . Alcohol  Use: No   OB History   Grav Para Term Preterm Abortions TAB SAB Ect Mult Living                 Review of Systems  Constitutional: Positive for chills. Negative for fever.  Respiratory: Positive for shortness of breath. Negative for cough.   Cardiovascular: Positive for chest pain. Negative for palpitations and leg swelling.  Gastrointestinal: Positive for nausea, vomiting and abdominal pain. Negative for diarrhea and blood in stool.  Genitourinary: Negative for dysuria.  Musculoskeletal: Negative for back pain.  Neurological: Positive for light-headedness.  All other systems reviewed and are negative.      Allergies  Magnesium-containing compounds and Other  Home Medications   Current Outpatient Rx  Name  Route  Sig  Dispense  Refill  . Calcium Carbonate Antacid (ANTACID PO)   Oral   Take 1 tablet by mouth daily.         Marland Kitchen ibuprofen (ADVIL,MOTRIN) 200 MG tablet   Oral   Take 400 mg by mouth every 6 (six) hours as needed for mild pain.         . methadone (DOLOPHINE) 10 MG/5ML solution   Oral   Take 8.4 mg by mouth daily.          BP 122/82  Pulse 107  Temp(Src) 98.7 F (37.1 C) (Oral)  Resp 18  Ht 5\' 2"  (1.575 m)  Wt 107 lb (48.535 kg)  BMI 19.57 kg/m2  SpO2 99% Physical Exam  Nursing note and vitals reviewed. Constitutional: She is  oriented to person, place, and time. She appears well-developed and well-nourished. She appears distressed.  Appears uncomfortable  HENT:  Head: Normocephalic and atraumatic.  Mouth/Throat: Oropharynx is clear and moist.  Eyes: Conjunctivae and EOM are normal. Pupils are equal, round, and reactive to light.  Neck: Neck supple.  Cardiovascular: Normal rate, regular rhythm and normal heart sounds.   Pulmonary/Chest: Effort normal and breath sounds normal. No respiratory distress.  Abdominal: Soft. Bowel sounds are normal. There is tenderness. There is no rebound and no guarding.  TTP epigastrium and LUQ  Musculoskeletal:  She exhibits no edema.  Neurological: She is alert and oriented to person, place, and time. No cranial nerve deficit.  Skin: Skin is warm and dry. She is not diaphoretic.    ED Course  Procedures (including critical care time) Labs Review Labs Reviewed  CBC WITH DIFFERENTIAL  COMPREHENSIVE METABOLIC PANEL  I-STAT TROPOININ, ED   Imaging Review No results found.   EKG Interpretation   Date/Time:  Sunday May 22 2013 13:07:34 EDT Ventricular Rate:  101 PR Interval:  128 QRS Duration: 78 QT Interval:  510 QTC Calculation: 661 R Axis:   71 Text Interpretation:  Sinus tachycardia ST \\T \ Marked T-wave abnormality,  consider inferolateral ischemia No previous ECGs available Confirmed by  [facility]  MD, Jonny RuizJOHN ([id number]) on 05/22/2013 2:17:24 PM      MDM   This is a [age]yo WF w/ PMH endometriosis, narcotic abuse on methadone therapy, and chronic N/V who presents with N/V and epigastric pain x 4 days. Patient had normal LFTs and lipase at her visit two days ago, so hepatitis and pancreatitis are less likely. She had a mild leukocytosis two days ago, so we will recheck CBC. Her CT abd/pelvis during her last visit was fairly unremarkable, though did show a mildly dilated CBD. Radiology mentioned patient may benefit from MRCP if symptoms persist, so this may need to be arranged as outpatient. Patient's EKG shows prolonged QT (660ms), ST depression in leads V4-5-- we do not have a prior EKG for comparison. Troponin here is negative and CP has been ongoing since last night, so ACS is unlikely. Given her long QT we cannot give typical antiemetics. Will start with ativan 1mg  IV and plan to give oral hydroxyzine if patient can tolerate (per pharmacy, they do not have IV hydroxyzine in stock currently). Will hold off on narcotics for now as patient is h/o opioid abuse.   3:15 PM Patient's nausea is improved since receiving ativan. Will attempt to give hydroxyzine PO now. WBC count has trended down from her  last visit, 17.2--> 12.2. CMP unremarkable. Troponin negative. Will plan to discharge patient home with short prescription for ativan once her symptoms are controlled here in the ED.   4:10 PM Patient still complaining of epigastric pain. We will give her percocet 5-325mg  x 1 dose to control her pain. We will not give her pain medication to go home with.     Windell Hummingbirdachel Ranell Finelli, MD 05/22/13 [phone]

## 2013-12-16 ENCOUNTER — Other Ambulatory Visit: Payer: Self-pay

## 2019-03-17 DIAGNOSIS — M436 Torticollis: Secondary | ICD-10-CM

## 2019-03-17 DIAGNOSIS — M542 Cervicalgia: Secondary | ICD-10-CM

## 2019-03-17 DIAGNOSIS — M545 Low back pain: Secondary | ICD-10-CM

## 2023-05-12 ENCOUNTER — Emergency Department (HOSPITAL_COMMUNITY)
Admission: EM | Admit: 2023-05-12 | Discharge: 2023-05-12 | Disposition: A | Discharge status: Home / Self Care | Payer: Self-pay | Attending: Emergency Medicine | Admitting: Emergency Medicine

## 2023-05-12 ENCOUNTER — Emergency Department (HOSPITAL_COMMUNITY): Payer: Self-pay

## 2023-05-12 ENCOUNTER — Other Ambulatory Visit: Payer: Self-pay

## 2023-05-12 ENCOUNTER — Encounter (HOSPITAL_COMMUNITY): Payer: Self-pay

## 2023-05-12 DIAGNOSIS — K1121 Acute sialoadenitis: Secondary | ICD-10-CM | POA: Insufficient documentation

## 2023-05-12 DIAGNOSIS — R220 Localized swelling, mass and lump, head: Secondary | ICD-10-CM | POA: Diagnosis present

## 2023-05-12 LAB — CBC WITH DIFFERENTIAL/PLATELET
Abs Immature Granulocytes: 0.03 10*3/uL (ref 0.00–0.07)
Basophils Absolute: 0.1 10*3/uL (ref 0.0–0.1)
Basophils Relative: 1 %
Eosinophils Absolute: 0.2 10*3/uL (ref 0.0–0.5)
Eosinophils Relative: 2 %
HCT: 41.9 % (ref 36.0–46.0)
Hemoglobin: 13.7 g/dL (ref 12.0–15.0)
Immature Granulocytes: 0 %
Lymphocytes Relative: 33 %
Lymphs Abs: 3.7 10*3/uL (ref 0.7–4.0)
MCH: 29 pg (ref 26.0–34.0)
MCHC: 32.7 g/dL (ref 30.0–36.0)
MCV: 88.6 fL (ref 80.0–100.0)
Monocytes Absolute: 0.7 10*3/uL (ref 0.1–1.0)
Monocytes Relative: 6 %
Neutro Abs: 6.5 10*3/uL (ref 1.7–7.7)
Neutrophils Relative %: 58 %
Platelets: 275 10*3/uL (ref 150–400)
RBC: 4.73 MIL/uL (ref 3.87–5.11)
RDW: 13 % (ref 11.5–15.5)
WBC: 11.2 10*3/uL — ABNORMAL HIGH (ref 4.0–10.5)
nRBC: 0 % (ref 0.0–0.2)

## 2023-05-12 LAB — COMPREHENSIVE METABOLIC PANEL
ALT: 11 U/L (ref 0–44)
AST: 18 U/L (ref 15–41)
Albumin: 4 g/dL (ref 3.5–5.0)
Alkaline Phosphatase: 59 U/L (ref 38–126)
Anion gap: 9 (ref 5–15)
BUN: 9 mg/dL (ref 6–20)
CO2: 30 mmol/L (ref 22–32)
Calcium: 9.1 mg/dL (ref 8.9–10.3)
Chloride: 103 mmol/L (ref 98–111)
Creatinine, Ser: 0.85 mg/dL (ref 0.44–1.00)
GFR, Estimated: >60 mL/min (ref >60)
Glucose, Bld: 100 mg/dL — ABNORMAL HIGH (ref 70–99)
Potassium: 3.1 mmol/L — ABNORMAL LOW (ref 3.5–5.1)
Sodium: 142 mmol/L (ref 135–145)
Total Bilirubin: 0.6 mg/dL (ref 0.0–1.2)
Total Protein: 6.8 g/dL (ref 6.5–8.1)

## 2023-05-12 LAB — I-STAT CHEM 8, ED
BUN: 11 mg/dL (ref 6–20)
Calcium, Ion: 1.13 mmol/L — ABNORMAL LOW (ref 1.15–1.40)
Chloride: 101 mmol/L (ref 98–111)
Creatinine, Ser: 0.5 mg/dL (ref 0.44–1.00)
Glucose, Bld: 91 mg/dL (ref 70–99)
HCT: 42 % (ref 36.0–46.0)
Hemoglobin: 14.3 g/dL (ref 12.0–15.0)
Potassium: 3.1 mmol/L — ABNORMAL LOW (ref 3.5–5.1)
Sodium: 141 mmol/L (ref 135–145)
TCO2: 29 mmol/L (ref 22–32)

## 2023-05-12 LAB — RESP PANEL BY RT-PCR (RSV, FLU A&B, COVID)  RVPGX2
Influenza A by PCR: NEGATIVE
Influenza B by PCR: NEGATIVE
Resp Syncytial Virus by PCR: NEGATIVE
SARS Coronavirus 2 by RT PCR: NEGATIVE

## 2023-05-12 MED ORDER — AMOXICILLIN-POT CLAVULANATE 875-125 MG PO TABS: 1.0000 | ORAL_TABLET | Freq: Two times a day (BID) | ORAL | 0 refills | Status: DC

## 2023-05-12 MED ORDER — HYDROMORPHONE HCL 1 MG/ML IJ SOLN
0.5000 mg | Freq: Once | INTRAMUSCULAR | Status: AC
  Administered 2023-05-12: 0.5 mg via INTRAVENOUS
  Filled 2023-05-12: qty 1

## 2023-05-12 MED ORDER — IOHEXOL 350 MG/ML SOLN
75.0000 mL | Freq: Once | INTRAVENOUS | Status: AC | PRN
  Administered 2023-05-12: 75 mL via INTRAVENOUS

## 2023-05-12 MED ORDER — KETOROLAC TROMETHAMINE 15 MG/ML IJ SOLN
15.0000 mg | Freq: Once | INTRAMUSCULAR | Status: DC
  Filled 2023-05-12: qty 1

## 2023-05-12 MED ORDER — AMOXICILLIN-POT CLAVULANATE 875-125 MG PO TABS: 1.0000 | ORAL_TABLET | Freq: Two times a day (BID) | ORAL | 0 refills | Status: AC

## 2023-05-12 NOTE — ED Provider Notes (Signed)
 [facility] EMERGENCY DEPARTMENT AT [facility] Provider Note   CSN: [id number] Arrival date & time: 05/12/23  1206     History {Add pertinent medical, surgical, social history, OB history to HPI:1} Chief Complaint  Patient presents with   Neck Pain   Facial Swelling    Sara Hayden is a 53 y.o. female.  53 year old female who presents to the emergency department with left-sided jaw swelling.  Patient reports that over the past few days she has had congestion and cough and low-grade fever.  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  She palpated the area and noticed that there was swelling.  Says it feels strange to swallow at this point in time.  No dental pain.  No strange taste in her mouth.  No head or neck surgery.       Home Medications Prior to Admission medications   Medication Sig Start Date End Date Taking? Authorizing Provider  Calcium Carbonate Antacid (ANTACID PO) Take 1 tablet by mouth daily.    [provider]  ibuprofen (ADVIL,MOTRIN) 200 MG tablet Take 400 mg by mouth every 6 (six) hours as needed for mild pain.    [provider]  LORazepam (ATIVAN) 1 MG tablet Take 1 tablet (1 mg total) by mouth every 8 (eight) hours. 05/22/13   Coolidge Breeze, MD  methadone (DOLOPHINE) 10 MG/5ML solution Take 8.4 mg by mouth daily.    [provider]      Allergies    Magnesium-containing compounds and Other    Review of Systems   Review of Systems  Physical Exam Updated Vital Signs BP (!) 143/84 (BP Location: Left Arm)   Pulse 88   Temp 98.2 F (36.8 C) (Oral)   SpO2 99%  Physical Exam Vitals and nursing note reviewed.  Constitutional:      General: She is not in acute distress.    Appearance: She is well-developed.     Comments: Tolerating her secretions  HENT:     Head: Atraumatic.     Comments: Significant swelling to left side of face  at the angle of the mandible    Right Ear: External ear normal.     Left Ear: External ear normal.     Nose: Nose normal.     Mouth/Throat:     Comments: Uvula: without deviation or edema. Uvula midline. Soft palate: without swelling Sublingual: normal appearance/no brawny edema or tongue elevation Teeth and gums: No periapical swelling or fluctuance, no tooth fracture, no gingival swelling, no active oral bleeding. Upper dentures.  Multiple missing teeth of the mandible. No TTP of teeth.   Tonsils: no erythema or exudates  Unable to visualize sialolith Eyes:     Extraocular Movements: Extraocular movements intact.     Conjunctiva/sclera: Conjunctivae normal.     Pupils: Pupils are equal, round, and reactive to light.  Cardiovascular:     Rate and Rhythm: Normal rate and regular rhythm.     Heart sounds: No murmur heard. Pulmonary:     Effort: Pulmonary effort is normal. No respiratory distress.     Breath sounds: Normal breath sounds. No stridor.  Musculoskeletal:     Cervical back: Normal range of motion and neck supple.  Skin:    General: Skin is warm and dry.  Neurological:     Mental Status: She is alert and oriented to person, place, and time. Mental status is  at baseline.  Psychiatric:        Mood and Affect: Mood normal.     ED Results / Procedures / Treatments   Labs (all labs ordered are listed, but only abnormal results are displayed) Labs Reviewed  I-STAT CHEM 8, ED - Abnormal; Notable for the following components:      Result Value   Potassium 3.1 (*)    Calcium, Ion 1.13 (*)    All other components within normal limits  CBC WITH DIFFERENTIAL/PLATELET  COMPREHENSIVE METABOLIC PANEL    EKG None  Radiology No results found.  Procedures Procedures  {Document cardiac monitor, telemetry assessment procedure when appropriate:1}  Medications Ordered in ED Medications - No data to display  ED Course/ Medical Decision Making/ A&P   {   Click here for  ABCD2, HEART and other calculatorsREFRESH Note before signing :1}                              Medical Decision Making Amount and/or Complexity of Data Reviewed Labs: ordered. Radiology: ordered.  Risk Prescription drug management.   ***  {Document critical care time when appropriate:1} {Document review of labs and clinical decision tools ie heart score, Chads2Vasc2 etc:1}  {Document your independent review of radiology images, and any outside records:1} {Document your discussion with family members, caretakers, and with consultants:1} {Document social determinants of health affecting pt's care:1} {Document your decision making why or why not admission, treatments were needed:1} Final Clinical Impression(s) / ED Diagnoses Final diagnoses:  None    Rx / DC Orders ED Discharge Orders     None

## 2023-05-12 NOTE — Discharge Instructions (Signed)
 You were seen for your salivary gland infection (parotitis) in the emergency department.   At home, please take the antibiotics and use sour candies to help increase salivation  Check your MyChart online for the results of any tests that had not resulted by the time you left the emergency department.   Follow-up with your primary doctor and ENT in 2-3 days regarding your visit.    Return immediately to the emergency department if you experience any of the following: Fevers, difficulty speaking or swallowing, or any other concerning symptoms.    Thank you for visiting our Emergency Department. It was a pleasure taking care of you today.

## 2023-05-12 NOTE — ED Notes (Signed)
Pt declined DC vitals 

## 2023-05-12 NOTE — ED Triage Notes (Signed)
 Pt arrives via EMS from her workplace. Pt reports sudden onset of pain and swelling to left side of face/jaw about 30 mins ago. Pt is AxOx4. Airway is patent. Pt does report recent cold symptoms and low grade fever.

## 2023-05-12 NOTE — ED Notes (Signed)
 Pt to CT
